# Patient Record
Sex: Male | Born: 1980 | Race: White | Hispanic: No | Marital: Married | Smoking: Current every day smoker
Health system: Southern US, Community
[De-identification: ages and names within clinical notes are randomized; demographics above are authoritative.]

## PROBLEM LIST (undated history)

## (undated) DIAGNOSIS — M109 Gout, unspecified: Secondary | ICD-10-CM

## (undated) DIAGNOSIS — I10 Essential (primary) hypertension: Secondary | ICD-10-CM

## (undated) DIAGNOSIS — R7989 Other specified abnormal findings of blood chemistry: Secondary | ICD-10-CM

## (undated) DIAGNOSIS — E119 Type 2 diabetes mellitus without complications: Secondary | ICD-10-CM

## (undated) DIAGNOSIS — G4733 Obstructive sleep apnea (adult) (pediatric): Secondary | ICD-10-CM

## (undated) HISTORY — DX: Gout, unspecified: M10.9

## (undated) HISTORY — PX: CHOLECYSTECTOMY: SHX55

## (undated) HISTORY — DX: Obstructive sleep apnea (adult) (pediatric): G47.33

## (undated) HISTORY — DX: Other specified abnormal findings of blood chemistry: R79.89

## (undated) HISTORY — DX: Essential (primary) hypertension: I10

---

## 2008-10-21 HISTORY — PX: WISDOM TOOTH EXTRACTION: SHX21

## 2014-03-07 ENCOUNTER — Ambulatory Visit (INDEPENDENT_AMBULATORY_CARE_PROVIDER_SITE_OTHER): Payer: 59 | Admitting: Physician Assistant

## 2014-03-07 ENCOUNTER — Encounter: Payer: Self-pay | Admitting: Physician Assistant

## 2014-03-07 VITALS — BP 132/79 | HR 74 | Temp 98.5°F | Ht 73.0 in | Wt 242.4 lb

## 2014-03-07 DIAGNOSIS — M109 Gout, unspecified: Secondary | ICD-10-CM | POA: Insufficient documentation

## 2014-03-07 MED ORDER — INDOMETHACIN 50 MG PO CAPS: 50.0000 mg | ORAL_CAPSULE | Freq: Three times a day (TID) | ORAL | Status: DC | PRN

## 2014-03-07 NOTE — Patient Instructions (Signed)

## 2014-03-07 NOTE — Addendum Note (Signed)
Addended by: Inis SizerWEBSTER, WILLIAM L on: 03/07/2014 01:23 PM   Modules accepted: Orders

## 2014-03-07 NOTE — Progress Notes (Signed)
Subjective:     Patient ID: Casey MeyerJames Michael Hoover, male   DOB: 12-20-1980, 33 y.o.   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

## 2014-04-04 DIAGNOSIS — Z0289 Encounter for other administrative examinations: Secondary | ICD-10-CM

## 2014-04-28 ENCOUNTER — Ambulatory Visit (INDEPENDENT_AMBULATORY_CARE_PROVIDER_SITE_OTHER): Payer: 59 | Admitting: Family Medicine

## 2014-04-28 ENCOUNTER — Encounter: Payer: Self-pay | Admitting: Family Medicine

## 2014-04-28 VITALS — BP 153/93 | HR 88 | Temp 98.6°F | Ht 73.0 in | Wt 343.2 lb

## 2014-04-28 DIAGNOSIS — H6011 Cellulitis of right external ear: Secondary | ICD-10-CM

## 2014-04-28 DIAGNOSIS — H9209 Otalgia, unspecified ear: Secondary | ICD-10-CM

## 2014-04-28 DIAGNOSIS — H60391 Other infective otitis externa, right ear: Secondary | ICD-10-CM

## 2014-04-28 DIAGNOSIS — H60399 Other infective otitis externa, unspecified ear: Secondary | ICD-10-CM

## 2014-04-28 DIAGNOSIS — H9201 Otalgia, right ear: Secondary | ICD-10-CM

## 2014-04-28 MED ORDER — NEOMYCIN-COLIST-HC-THONZONIUM 3.3-3-10-0.5 MG/ML OT SUSP: OTIC | Status: DC

## 2014-04-28 MED ORDER — TRAMADOL HCL 50 MG PO TABS: ORAL_TABLET | ORAL | Status: DC

## 2014-04-28 MED ORDER — CEPHALEXIN 500 MG PO CAPS: 500.0000 mg | ORAL_CAPSULE | Freq: Three times a day (TID) | ORAL | Status: DC

## 2014-04-28 NOTE — Progress Notes (Signed)
   Subjective:    Patient ID: Casey Hoover, male    DOB: April 21, 1981, 33 y.o.   MRN: [id number]  HPI  Pt is here today for acute onset of Right ear pain. It started hurting 2 days ago. The patient has been swimming a lot. He applied some ear drops last evening there   Review of Systems  Constitutional: Negative for fever.  HENT: Positive for ear pain (Right x 2 days).   Neurological: Negative for headaches.       Objective:   Physical Exam  Nursing note and vitals reviewed. Constitutional: He appears well-developed and well-nourished. No distress.  HENT:  Head: Normocephalic and atraumatic.  Left Ear: External ear normal.  Mouth/Throat: Oropharynx is clear and moist. No oropharyngeal exudate.  The right ear canal is swollen with drainage. The TM was not visualized. The left TM was normal. There is right anterior cervical tenderness.  Eyes: Conjunctivae and EOM are normal. Pupils are equal, round, and reactive to light. Right eye exhibits no discharge. Left eye exhibits no discharge. No scleral icterus.  Neck: Normal range of motion. Neck supple.  Cardiovascular: Normal rate, regular rhythm and normal heart sounds.   No murmur heard. Pulmonary/Chest: Effort normal and breath sounds normal. He has no wheezes. He has no rales.  Musculoskeletal: Normal range of motion.  Lymphadenopathy:    He has no cervical adenopathy.  Skin: Skin is warm and dry. No rash noted.  Psychiatric: He has a normal mood and affect. His behavior is normal. Judgment and thought content normal.   BP 153/93  Pulse 88  Temp(Src) 98.6 F (37 C) (Oral)  Ht 6\' 1"  (1.854 m)  Wt 343 lb 3.2 oz (155.674 kg)  BMI 45.29 kg/m2        Assessment & Plan:  1. Otitis, externa, infective, right - neomycin-colistin-hydrocortisone-thonzonium (CORTISPORIN-TC) 3.12-21-08-0.5 MG/ML otic suspension; Use 3-4 drops 4 times daily and apply to the cotton wick in the right ear canal for 7-10 day  Dispense: 10 mL; Refill:  1 - cephALEXin (KEFLEX) 500 MG capsule; Take 1 capsule (500 mg total) by mouth 3 (three) times daily.  Dispense: 30 capsule; Refill: 0  2. Cellulitis of ear canal, right - neomycin-colistin-hydrocortisone-thonzonium (CORTISPORIN-TC) 3.12-21-08-0.5 MG/ML otic suspension; Use 3-4 drops 4 times daily and apply to the cotton wick in the right ear canal for 7-10 day  Dispense: 10 mL; Refill: 1 - cephALEXin (KEFLEX) 500 MG capsule; Take 1 capsule (500 mg total) by mouth 3 (three) times daily.  Dispense: 30 capsule; Refill: 0  3. Ear pain, right - traMADol (ULTRAM) 50 MG tablet; One half to one tablet every 8 hours -12 hours as needed for severe pain  Dispense: 15 tablet; Refill: 0  Patient Instructions  Take antibiotic as directed Use prescription ear drops as directed with a cotton wick If swimming,  use ear plugs Discuss with pharmacist prophylactic ear drops that can be used before swimming and after swimming once the prescription ear drops and antibiotics are complete Take Tylenol if needed for pain, if severe pain may use tramadol   Nyra Capeson W. Da Authement MD

## 2014-04-28 NOTE — Patient Instructions (Signed)
Take antibiotic as directed Use prescription ear drops as directed with a cotton wick If swimming,  use ear plugs Discuss with pharmacist prophylactic ear drops that can be used before swimming and after swimming once the prescription ear drops and antibiotics are complete Take Tylenol if needed for pain, if severe pain may use tramadol

## 2014-05-06 ENCOUNTER — Telehealth: Payer: Self-pay | Admitting: Family Medicine

## 2014-05-06 NOTE — Telephone Encounter (Signed)
Give call to triage

## 2014-05-06 NOTE — Telephone Encounter (Signed)
Patient was seeing improvement until he discontinued drops on 7/15 and then the swelling and pain returned.  He is on vacation but has his drops with him. There was also one refill provided. Advised to continue with drops for several more days. If he needs a refill while at the beach he can have it transferred from his local pharmacy to one close by. Made them aware that there is always someone available by phone if symptoms persist or worsen.  Mother stated understanding and agreement to plan.

## 2014-06-21 ENCOUNTER — Telehealth: Payer: Self-pay | Admitting: Family Medicine

## 2014-06-21 DIAGNOSIS — G4730 Sleep apnea, unspecified: Secondary | ICD-10-CM

## 2014-06-21 NOTE — Telephone Encounter (Signed)
Please review

## 2014-06-21 NOTE — Telephone Encounter (Signed)
If the patient has not already been scheduled for his, please do this with Dr. Jetty Duhamel

## 2014-06-21 NOTE — Telephone Encounter (Signed)
Referral set up

## 2014-08-04 ENCOUNTER — Encounter: Payer: Self-pay | Admitting: Pulmonary Disease

## 2014-08-04 ENCOUNTER — Ambulatory Visit (INDEPENDENT_AMBULATORY_CARE_PROVIDER_SITE_OTHER): Payer: 59 | Admitting: Pulmonary Disease

## 2014-08-04 VITALS — BP 152/84 | HR 75 | Temp 98.3°F | Ht 73.0 in | Wt 353.8 lb

## 2014-08-04 DIAGNOSIS — G4733 Obstructive sleep apnea (adult) (pediatric): Secondary | ICD-10-CM

## 2014-08-04 DIAGNOSIS — R0683 Snoring: Secondary | ICD-10-CM

## 2014-08-04 HISTORY — DX: Obstructive sleep apnea (adult) (pediatric): G47.33

## 2014-08-04 NOTE — Progress Notes (Signed)
Chief Complaint  Patient presents with  . SLEEP CONSULT    Referred by Dr Christell ConstantMoore. Shifts vary with work. Epworth Score: 17    History of Present Illness: Casey Hoover is a 33 y.o. male for evaluation of sleep problems.  [place] after he got an ear infection complicated by sepsis.  He was told by doctors then that he needed evaluation for sleep apnea.  He is a loud snorer, and he has been told he stops breathing while asleep.  He has more trouble on his back.  He will wake up feeling sweaty and with palpitations.  He works rotating shift with [employer].  He works 12 hour shifts 3 to 5 days per week.  He does 2 weeks of days, and then 2 weeks of nights.  He gets about 5 hours sleep per day on average.  He feels tired through out the day.  He does not use anything to help him sleep or stay awake.  He will talk in his sleep.  He denies sleep walking, sleep talking, bruxism, or nightmares.  There is no history of restless legs.  He denies sleep hallucinations, sleep paralysis, or cataplexy.  The Epworth score is 17 out of 24.   Casey MeyerJames Michael Hoover  has a past medical history of Gout and HTN (hypertension).  Casey Hoover  has past surgical history that includes Wisdom tooth extraction (2010).  Prior to Admission medications   Medication Sig Start Date End Date Taking? Authorizing Provider  HYDROcodone-acetaminophen (NORCO/VICODIN) 5-325 MG per tablet Take 1 tablet by mouth every 6 (six) hours as needed for moderate pain.   Yes Historical Provider, MD  neomycin-colistin-hydrocortisone-thonzonium (CORTISPORIN-TC) 3.12-21-08-0.5 MG/ML otic suspension Use 3-4 drops 4 times daily and apply to the cotton wick in the right ear canal for 7-10 day 04/28/14  Yes Ernestina Pennaonald W Moore, MD    Allergies  Allergen Reactions  . Sulfur Rash    His family history includes Cancer in his father; Diabetes in his other; Heart Problems in his other; Hypertension in his  other.  He  reports that he has been smoking Cigarettes.  He has a 15 pack-year smoking history. He does not have any smokeless tobacco history on file. He reports that he drinks alcohol. He reports that he does not use illicit drugs.  Review of Systems  Constitutional: Negative for fever and unexpected weight change.  HENT: Positive for ear pain. Negative for congestion, dental problem, nosebleeds, postnasal drip, rhinorrhea, sinus pressure, sneezing, sore throat and trouble swallowing.   Eyes: Negative for redness and itching.  Respiratory: Positive for shortness of breath ( rest/exertion). Negative for cough, chest tightness and wheezing.   Cardiovascular: Negative for palpitations and leg swelling.  Gastrointestinal: Negative for nausea and vomiting.       Acid heartburn/indigestion  Genitourinary: Negative for dysuria.  Musculoskeletal: Negative for joint swelling.  Skin: Negative for rash.  Neurological: Negative for headaches.  Hematological: Does not bruise/bleed easily.  Psychiatric/Behavioral: Negative for dysphoric mood. The patient is not nervous/anxious.    Physical Exam:  General - No distress ENT - No sinus tenderness, no oral exudate, no LAN, no thyromegaly, TM clear, pupils equal/reactive, MP 4, 2+ tonsils, enlarged tongue Cardiac - s1s2 regular, no murmur, pulses symmetric Chest - No wheeze/rales/dullness, good air entry, normal respiratory excursion Back - No focal tenderness Abd - Soft, non-tender, no organomegaly, + bowel sounds Ext - No edema Neuro - Normal strength,  cranial nerves intact Skin - No rashes Psych - Normal mood, and behavior  Assessment/plan:  Chesley Mires, M.D. Pager [phone]

## 2014-08-04 NOTE — Progress Notes (Deleted)
   Subjective:    Patient ID: Casey Hoover, male    DOB: December 31, 1980, 31 y.o.   MRN: [id number]  HPI    Review of Systems  Constitutional: Negative for fever and unexpected weight change.  HENT: Positive for ear pain. Negative for congestion, dental problem, nosebleeds, postnasal drip, rhinorrhea, sinus pressure, sneezing, sore throat and trouble swallowing.   Eyes: Negative for redness and itching.  Respiratory: Positive for shortness of breath ( rest/exertion). Negative for cough, chest tightness and wheezing.   Cardiovascular: Negative for palpitations and leg swelling.  Gastrointestinal: Negative for nausea and vomiting.       Acid heartburn/indigestion  Genitourinary: Negative for dysuria.  Musculoskeletal: Negative for joint swelling.  Skin: Negative for rash.  Neurological: Negative for headaches.  Hematological: Does not bruise/bleed easily.  Psychiatric/Behavioral: Negative for dysphoric mood. The patient is not nervous/anxious.        Objective:   Physical Exam        Assessment & Plan:

## 2014-08-04 NOTE — Patient Instructions (Signed)
Will arrange for home sleep study Will call to arrange for follow up after sleep study reviewed  

## 2014-08-04 NOTE — Assessment & Plan Note (Signed)
He has snoring, sleep disruption, witnessed apnea, and daytime sleepiness.  His BMI is > 35.  I am concerned he could have sleep apnea.  We discussed how sleep apnea can affect various health problems including risks for hypertension, cardiovascular disease, and diabetes.  We also discussed how sleep disruption can increase risks for accident, such as while driving.  Weight loss as a means of improving sleep apnea was also reviewed.  Additional treatment options discussed were CPAP therapy, oral appliance, and surgical intervention.  To further assess will arrange for home sleep study pending insurance approval.

## 2014-09-19 ENCOUNTER — Telehealth: Payer: Self-pay | Admitting: Pulmonary Disease

## 2014-09-19 NOTE — Telephone Encounter (Signed)
Called and spoke with pt's wife and she stated that she would be able to pick up the device on Wed 09/21/14. HST arranged for 09/21/14. Wife to pick up device at 3:30 on 09/21/14. Rhonda J Cobb

## 2014-09-19 NOTE — Telephone Encounter (Signed)
[facility] please advise where the pt is on the waiting list. Thanks. Carron CurieJennifer Maleni Seyer, CMA

## 2014-09-21 DIAGNOSIS — G4730 Sleep apnea, unspecified: Secondary | ICD-10-CM

## 2014-09-29 ENCOUNTER — Encounter: Payer: Self-pay | Admitting: Pulmonary Disease

## 2014-09-29 ENCOUNTER — Telehealth: Payer: Self-pay | Admitting: Pulmonary Disease

## 2014-09-29 DIAGNOSIS — G4730 Sleep apnea, unspecified: Secondary | ICD-10-CM

## 2014-09-29 NOTE — Telephone Encounter (Signed)
Dr. Craige CottaSood will be back in the office tomorrow. Pt had HST done. [name] is aware we will call once Dr. Craige CottaSood reviews results. Please advise thanks

## 2014-09-29 NOTE — Telephone Encounter (Signed)
HST 12/012/15 >> AHI 33.9, SaO2 low 77%.  Will have my nurse inform pt that sleep study shows severe sleep apnea.  Options are either to start CPAP now or have ROV first.  If he is agreeable to start CPAP now, then arrange for auto CPAP with pressure 5 to 15 cm H2O with heated humidity and mask of choice.  Have download sent two weeks after starting CPAP and schedule ROV 2 months after starting CPAP.

## 2014-10-03 ENCOUNTER — Encounter: Payer: Self-pay | Admitting: Pulmonary Disease

## 2014-10-03 ENCOUNTER — Telehealth: Payer: Self-pay | Admitting: Pulmonary Disease

## 2014-10-03 DIAGNOSIS — G4733 Obstructive sleep apnea (adult) (pediatric): Secondary | ICD-10-CM

## 2014-10-03 NOTE — Telephone Encounter (Signed)
Spoke with Danille (SO), will contact our office back once she speaks with with patient.  Will await call.

## 2014-10-03 NOTE — Telephone Encounter (Signed)
Per 09/29/14 phone note: Coralyn HellingVineet Sood, MD at 09/29/2014 5:34 PM     Status: Signed       Expand All Collapse All   HST 12/012/15 >> AHI 33.9, SaO2 low 77%.  Will have my nurse inform pt that sleep study shows severe sleep apnea.  Options are either to start CPAP now or have ROV first.  If he is agreeable to start CPAP now, then arrange for auto CPAP with pressure 5 to 15 cm H2O with heated humidity and mask of choice. Have download sent two weeks after starting CPAP and schedule ROV 2 months after starting CPAP.    --  LMOMTCB X1

## 2014-10-04 NOTE — Telephone Encounter (Signed)
Spoke with pt's girlfriend and she states pt is ready to start cpap therapy and would like to get this before the end of the year when deductible starts over .  Order placed per Dr Craige CottaSood.

## 2014-10-06 NOTE — Telephone Encounter (Signed)
Per 12.14.15 phone note, CPAP order placed Will sign off

## 2014-10-17 ENCOUNTER — Telehealth: Payer: Self-pay | Admitting: Pulmonary Disease

## 2014-10-17 NOTE — Telephone Encounter (Signed)
[facility]'s [facility] that [facility] on 12.15.15 Called [facility] and spoke with Marchelle FolksAmanda who reported that their system shows no sign of order being received.  She did provide a fax number to send order/records to.  Did advise Marchelle Folksmanda that pt had requested to be set up with CPAP prior to the end of the calendar year.  With Dawne's assistance, printed off the order (that also states pt wants CPAP before the end of the year), the sleep study, last ov note and copy if insurance card.  Order stamped and all records faxed to [facility].  LMOM TCB x1 for Danielle to discuss the above.

## 2014-10-18 NOTE — Telephone Encounter (Signed)
Spoke with United StationersDanielle.  Discussed below.  She wants to clarify exactly when [facility] will be calling. [facility], spoke with Marchelle FolksAmanda.  Was advised they did receive order from yesterday and would be contacting pt today. Spoke with Duwayne Heckanielle again.  Advised of above per Marchelle FolksAmanda.  She verbalized understanding, is to call office back if [facility] does not reach out to them today, and voiced no further questions or concerns at this time.  She was very appreciative of our help.

## 2015-07-11 ENCOUNTER — Ambulatory Visit (INDEPENDENT_AMBULATORY_CARE_PROVIDER_SITE_OTHER): Payer: 59 | Admitting: Family Medicine

## 2015-07-11 ENCOUNTER — Encounter: Payer: Self-pay | Admitting: Family Medicine

## 2015-07-11 VITALS — BP 156/88 | HR 87 | Temp 98.3°F | Ht 73.0 in | Wt 365.0 lb

## 2015-07-11 DIAGNOSIS — R5382 Chronic fatigue, unspecified: Secondary | ICD-10-CM | POA: Diagnosis not present

## 2015-07-11 DIAGNOSIS — E669 Obesity, unspecified: Secondary | ICD-10-CM

## 2015-07-11 DIAGNOSIS — E1159 Type 2 diabetes mellitus with other circulatory complications: Secondary | ICD-10-CM | POA: Insufficient documentation

## 2015-07-11 DIAGNOSIS — I152 Hypertension secondary to endocrine disorders: Secondary | ICD-10-CM | POA: Insufficient documentation

## 2015-07-11 DIAGNOSIS — R5383 Other fatigue: Secondary | ICD-10-CM | POA: Insufficient documentation

## 2015-07-11 DIAGNOSIS — I10 Essential (primary) hypertension: Secondary | ICD-10-CM | POA: Diagnosis not present

## 2015-07-11 DIAGNOSIS — M109 Gout, unspecified: Secondary | ICD-10-CM

## 2015-07-11 MED ORDER — PREDNISONE 20 MG PO TABS: ORAL_TABLET | ORAL | Status: DC

## 2015-07-11 MED ORDER — LOSARTAN POTASSIUM 25 MG PO TABS: 25.0000 mg | ORAL_TABLET | Freq: Every day | ORAL | Status: DC

## 2015-07-11 NOTE — Patient Instructions (Addendum)
Nice to meet you!  Start the losartan as soon as possible  We will call with your labs within a week.   Hypertension Hypertension, commonly called high blood pressure, is when the force of blood pumping through your arteries is too strong. Your arteries are the blood vessels that carry blood from your heart throughout your body. A blood pressure reading consists of a higher number over a lower number, such as 110/72. The higher number (systolic) is the pressure inside your arteries when your heart pumps. The lower number (diastolic) is the pressure inside your arteries when your heart relaxes. Ideally you want your blood pressure below 120/80. Hypertension forces your heart to work harder to pump blood. Your arteries may become narrow or stiff. Having hypertension puts you at risk for heart disease, stroke, and other problems.  RISK FACTORS Some risk factors for high blood pressure are controllable. Others are not.  Risk factors you cannot control include:   Race. You may be at higher risk if you are African American.  Age. Risk increases with age.  Gender. Men are at higher risk than women before age 62 years. After age 41, women are at higher risk than men. Risk factors you can control include:  Not getting enough exercise or physical activity.  Being overweight.  Getting too much fat, sugar, calories, or salt in your diet.  Drinking too much alcohol. SIGNS AND SYMPTOMS Hypertension does not usually cause signs or symptoms. Extremely high blood pressure (hypertensive crisis) may cause headache, anxiety, shortness of breath, and nosebleed. DIAGNOSIS  To check if you have hypertension, your health care provider will measure your blood pressure while you are seated, with your arm held at the level of your heart. It should be measured at least twice using the same arm. Certain conditions can cause a difference in blood pressure between your right and left arms. A blood pressure reading  that is higher than normal on one occasion does not mean that you need treatment. If one blood pressure reading is high, ask your health care provider about having it checked again. TREATMENT  Treating high blood pressure includes making lifestyle changes and possibly taking medicine. Living a healthy lifestyle can help lower high blood pressure. You may need to change some of your habits. Lifestyle changes may include:  Following the DASH diet. This diet is high in fruits, vegetables, and whole grains. It is low in salt, red meat, and added sugars.  Getting at least 2 hours of brisk physical activity every week.  Losing weight if necessary.  Not smoking.  Limiting alcoholic beverages.  Learning ways to reduce stress. If lifestyle changes are not enough to get your blood pressure under control, your health care provider may prescribe medicine. You may need to take more than one. Work closely with your health care provider to understand the risks and benefits. HOME CARE INSTRUCTIONS  Have your blood pressure rechecked as directed by your health care provider.   Take medicines only as directed by your health care provider. Follow the directions carefully. Blood pressure medicines must be taken as prescribed. The medicine does not work as well when you skip doses. Skipping doses also puts you at risk for problems.   Do not smoke.   Monitor your blood pressure at home as directed by your health care provider. SEEK MEDICAL CARE IF:   You think you are having a reaction to medicines taken.  You have recurrent headaches or feel dizzy.  You have  swelling in your ankles.  You have trouble with your vision. SEEK IMMEDIATE MEDICAL CARE IF:  You develop a severe headache or confusion.  You have unusual weakness, numbness, or feel faint.  You have severe chest or abdominal pain.  You vomit repeatedly.  You have trouble breathing. MAKE SURE YOU:   Understand these  instructions.  Will watch your condition.  Will get help right away if you are not doing well or get worse. Document Released: 10/07/2005 Document Revised: 02/21/2014 Document Reviewed: 07/30/2013 [facility] Patient Information 2015 [place], [place]. This information is not intended to replace advice given to you by your health care provider. Make sure you discuss any questions you have with your health care provider.

## 2015-07-11 NOTE — Progress Notes (Signed)
   HPI  Patient presents today here for knee pain and hypertension  Knee pain Patient spines he's had left knee pain over the last 5 days or so. He denies any injury. He states that it was red, warm, and slightly swollen. He has a history of gout but has never had gout in his knees.  He denies any fever, chills, sweats.  Fatigue He does complain of decreased energy, weight gain, and generalized fatigue over the last year. He also has decreased sex drive but denies any erectile dysfunction.  Hypertension No chest pain, dyspnea, palpitations Blood pressures frequently slightly elevated in the 150s   PMH: Smoking status noted - interested in quitting but is not sure if he wants to get ROS: Per HPI  Objective: BP 156/88 mmHg  Pulse 87  Temp(Src) 98.3 F (36.8 C) (Oral)  Ht $R'6\' 1"'IT$  (1.854 m)  Wt 365 lb (165.563 kg)  BMI 48.17 kg/m2 Gen: NAD, alert, cooperative with exam HEENT: NCAT CV: RRR, good S1/S2, no murmur Resp: CTABL, no wheezes, non-labored Ext: No edema, warm Neuro: Alert and oriented, No gross deficits MSK: L knee without erythema, effusion, bruising, or gross deformity No joint line tenderness.  No crepitus  Assessment and plan:  # Gout Check uric acid Prednisone course for presumed flare   # Hypertension Elevated on at least 2 occasions Starting losartan, has uricosuric effect Avoid thiazides Labs, follow-up one month for repeat labs and repeat BP check  # Fatigue Checking labs No clear etiology at this time, nonspecific    Orders Placed This Encounter  Procedures  . CBC  . CMP14+EGFR  . TSH  . Testosterone,Free and Total  . Uric acid    Meds ordered this encounter  Medications  . predniSONE (DELTASONE) 20 MG tablet    Sig: Take 2 tabs daily for 4 days, then take 1 tab daily for 4 days, then take one half tab daily 4 days then stop.    Dispense:  14 tablet    Refill:  0  . losartan (COZAAR) 25 MG tablet    Sig: Take 1 tablet (25 mg  total) by mouth daily.    Dispense:  30 tablet    Refill:  [place], MD [facility] 07/11/2015, 4:41 PM

## 2015-07-12 LAB — CMP14+EGFR
A/G RATIO: 1.9 (ref 1.1–2.5)
ALT: 49 IU/L — AB (ref 0–44)
AST: 18 IU/L (ref 0–40)
Albumin: 4.5 g/dL (ref 3.5–5.5)
Alkaline Phosphatase: 87 IU/L (ref 39–117)
BILIRUBIN TOTAL: 0.7 mg/dL (ref 0.0–1.2)
BUN/Creatinine Ratio: 9 (ref 8–19)
BUN: 11 mg/dL (ref 6–20)
CHLORIDE: 98 mmol/L (ref 97–108)
CO2: 25 mmol/L (ref 18–29)
Calcium: 9.8 mg/dL (ref 8.7–10.2)
Creatinine, Ser: 1.17 mg/dL (ref 0.76–1.27)
GFR calc non Af Amer: 82 mL/min/{1.73_m2} (ref >59)
GFR, EST AFRICAN AMERICAN: 95 mL/min/{1.73_m2} (ref >59)
Globulin, Total: 2.4 g/dL (ref 1.5–4.5)
Glucose: 162 mg/dL — ABNORMAL HIGH (ref 65–99)
POTASSIUM: 4.5 mmol/L (ref 3.5–5.2)
Sodium: 140 mmol/L (ref 134–144)
Total Protein: 6.9 g/dL (ref 6.0–8.5)

## 2015-07-12 LAB — TSH: TSH: 1.62 u[IU]/mL (ref 0.450–4.500)

## 2015-07-12 LAB — TESTOSTERONE,FREE AND TOTAL
TESTOSTERONE: 169 ng/dL — AB (ref 348–1197)
Testosterone, Free: 9.4 pg/mL (ref 8.7–25.1)

## 2015-07-12 LAB — CBC
Hematocrit: 47.7 % (ref 37.5–51.0)
Hemoglobin: 16.3 g/dL (ref 12.6–17.7)
MCH: 30.1 pg (ref 26.6–33.0)
MCHC: 34.2 g/dL (ref 31.5–35.7)
MCV: 88 fL (ref 79–97)
PLATELETS: 236 10*3/uL (ref 150–379)
RBC: 5.42 x10E6/uL (ref 4.14–5.80)
RDW: 13.6 % (ref 12.3–15.4)
WBC: 14.3 10*3/uL — ABNORMAL HIGH (ref 3.4–10.8)

## 2015-07-12 LAB — URIC ACID: Uric Acid: 7.7 mg/dL (ref 3.7–8.6)

## 2015-07-15 ENCOUNTER — Other Ambulatory Visit: Payer: Self-pay | Admitting: *Deleted

## 2015-07-15 DIAGNOSIS — R899 Unspecified abnormal finding in specimens from other organs, systems and tissues: Secondary | ICD-10-CM

## 2015-08-08 ENCOUNTER — Ambulatory Visit: Payer: 59 | Admitting: Family Medicine

## 2015-08-09 ENCOUNTER — Encounter: Payer: Self-pay | Admitting: Family Medicine

## 2015-08-23 ENCOUNTER — Telehealth: Payer: Self-pay | Admitting: Family Medicine

## 2015-09-25 ENCOUNTER — Telehealth: Payer: Self-pay | Admitting: Family Medicine

## 2015-09-25 NOTE — Telephone Encounter (Signed)
DENIED 

## 2015-12-11 NOTE — Telephone Encounter (Signed)
Appointment made tomorrow for med check

## 2015-12-12 ENCOUNTER — Ambulatory Visit: Payer: Self-pay | Admitting: Family Medicine

## 2015-12-13 ENCOUNTER — Telehealth: Payer: Self-pay | Admitting: Family Medicine

## 2015-12-13 ENCOUNTER — Encounter: Payer: Self-pay | Admitting: Family Medicine

## 2015-12-13 MED ORDER — OSELTAMIVIR PHOSPHATE 75 MG PO CAPS: 75.0000 mg | ORAL_CAPSULE | Freq: Every day | ORAL | Status: DC

## 2015-12-13 NOTE — Telephone Encounter (Signed)
Called and left VM  Sent in tamiflu at ppx dose.   Murtis Sink, MD [facility] 12/13/2015, 5:50 PM

## 2015-12-14 ENCOUNTER — Other Ambulatory Visit: Payer: Self-pay | Admitting: Nurse Practitioner

## 2015-12-23 ENCOUNTER — Emergency Department (HOSPITAL_COMMUNITY)
Admission: EM | Admit: 2015-12-23 | Discharge: 2015-12-23 | Disposition: A | Discharge status: Home / Self Care | Payer: 59 | Attending: Emergency Medicine | Admitting: Emergency Medicine

## 2015-12-23 ENCOUNTER — Emergency Department (HOSPITAL_COMMUNITY): Payer: 59

## 2015-12-23 ENCOUNTER — Encounter (HOSPITAL_COMMUNITY): Payer: Self-pay | Admitting: *Deleted

## 2015-12-23 DIAGNOSIS — R0789 Other chest pain: Secondary | ICD-10-CM | POA: Diagnosis not present

## 2015-12-23 DIAGNOSIS — R110 Nausea: Secondary | ICD-10-CM | POA: Diagnosis not present

## 2015-12-23 DIAGNOSIS — R079 Chest pain, unspecified: Secondary | ICD-10-CM | POA: Diagnosis present

## 2015-12-23 DIAGNOSIS — R61 Generalized hyperhidrosis: Secondary | ICD-10-CM | POA: Diagnosis not present

## 2015-12-23 DIAGNOSIS — Z9119 Patient's noncompliance with other medical treatment and regimen: Secondary | ICD-10-CM | POA: Insufficient documentation

## 2015-12-23 DIAGNOSIS — R0602 Shortness of breath: Secondary | ICD-10-CM | POA: Diagnosis not present

## 2015-12-23 DIAGNOSIS — I10 Essential (primary) hypertension: Secondary | ICD-10-CM | POA: Diagnosis not present

## 2015-12-23 DIAGNOSIS — F17210 Nicotine dependence, cigarettes, uncomplicated: Secondary | ICD-10-CM | POA: Insufficient documentation

## 2015-12-23 DIAGNOSIS — Z79899 Other long term (current) drug therapy: Secondary | ICD-10-CM | POA: Diagnosis not present

## 2015-12-23 DIAGNOSIS — G8929 Other chronic pain: Secondary | ICD-10-CM | POA: Diagnosis not present

## 2015-12-23 DIAGNOSIS — Z8669 Personal history of other diseases of the nervous system and sense organs: Secondary | ICD-10-CM | POA: Diagnosis not present

## 2015-12-23 DIAGNOSIS — M25511 Pain in right shoulder: Secondary | ICD-10-CM | POA: Insufficient documentation

## 2015-12-23 LAB — I-STAT TROPONIN, ED
TROPONIN I, POC: 0 ng/mL (ref 0.00–0.08)
Troponin i, poc: 0 ng/mL (ref 0.00–0.08)

## 2015-12-23 LAB — CBC
HEMATOCRIT: 48.4 % (ref 39.0–52.0)
Hemoglobin: 16.1 g/dL (ref 13.0–17.0)
MCH: 29.9 pg (ref 26.0–34.0)
MCHC: 33.3 g/dL (ref 30.0–36.0)
MCV: 89.8 fL (ref 78.0–100.0)
PLATELETS: 211 10*3/uL (ref 150–400)
RBC: 5.39 MIL/uL (ref 4.22–5.81)
RDW: 13.1 % (ref 11.5–15.5)
WBC: 10.6 10*3/uL — AB (ref 4.0–10.5)

## 2015-12-23 LAB — BASIC METABOLIC PANEL
Anion gap: 10 (ref 5–15)
BUN: 10 mg/dL (ref 6–20)
CHLORIDE: 105 mmol/L (ref 101–111)
CO2: 28 mmol/L (ref 22–32)
Calcium: 9.6 mg/dL (ref 8.9–10.3)
Creatinine, Ser: 1.33 mg/dL — ABNORMAL HIGH (ref 0.61–1.24)
GFR calc non Af Amer: >60 mL/min (ref >60)
Glucose, Bld: 99 mg/dL (ref 65–99)
POTASSIUM: 3.8 mmol/L (ref 3.5–5.1)
SODIUM: 143 mmol/L (ref 135–145)

## 2015-12-23 NOTE — ED Notes (Signed)
[facility]/o substernal cp beginning at 320pm today with N/V/SOB.  Hx: HTN.  EMS reports pt being clammy on arrival, resolved PTA.  324 ASA given, EKG unremarkable.  Pt reports noncompliance with HTN meds.  Pt a x 4, NAD. Bp-206/92 P-100 O2-96% RA.

## 2015-12-23 NOTE — ED Provider Notes (Signed)
CSN: [id number]     Arrival date & time 12/23/15  1713 History   First MD Initiated Contact with Patient 12/23/15 1726     Chief Complaint  Patient presents with  . Chest Pain     (Consider location/radiation/quality/duration/timing/severity/associated sxs/prior Treatment) HPI  35 year old male presents with chest pain left lateral to the sternum. Patient states the pain has been constant since it started 2 weeks ago but seemed to acutely worsen about 1 PM today. Patient was sitting on the couch when all of a sudden seem to get worse. He got nauseated and thinks he broke out in a sweat. Had some shortness of breath initially as well. Patient was given 3-4 mg aspirin. Patient is apparently noncompliant with hypertension medicine. Initial BP 206/92 by EMS. He was not given any nitroglycerin. Patient states he thinks the chest pain started due to stress because his grandmother was ill and then passed away one week ago. Patient does not have any worsening of his symptoms with exertion. It has been somewhat worse with inspiration. Also having chronic right shoulder pain that he thinks is from a rotator cuff. This is been ongoing for several months, not worse with this chest pain. No back pain. No abdominal pain. Pain has significantly improved while on route from EMS.  Past Medical History  Diagnosis Date  . Gout   . HTN (hypertension)   . OSA (obstructive sleep apnea) 08/04/2014   Past Surgical History  Procedure Laterality Date  . Wisdom tooth extraction  2010   Family History  Problem Relation Age of Onset  . Cancer Father   . Hypertension Other     multiple family members  . Diabetes Other     multiple family members  . Heart Problems Other     multiple family members   Social History  Substance Use Topics  . Smoking status: Current Every Day Smoker -- 1.00 packs/day for 15 years    Types: Cigarettes  . Smokeless tobacco: None  . Alcohol Use: Yes     Comment: social    Review  of Systems  Constitutional: Positive for diaphoresis. Negative for fever.  Respiratory: Negative for shortness of breath.   Cardiovascular: Positive for chest pain.  Gastrointestinal: Positive for nausea. Negative for vomiting and abdominal pain.  Musculoskeletal: Positive for arthralgias (right shoulder).  All other systems reviewed and are negative.     Allergies  Sulfur  Home Medications   Prior to Admission medications   Medication Sig Start Date End Date Taking? Authorizing Provider  losartan (COZAAR) 25 MG tablet Take 1 tablet (25 mg total) by mouth daily. 07/11/15   Elenora Gamma, MD  oseltamivir (TAMIFLU) 75 MG capsule Take 1 capsule (75 mg total) by mouth daily. 12/13/15   Elenora Gamma, MD  predniSONE (DELTASONE) 20 MG tablet Take 2 tabs daily for 4 days, then take 1 tab daily for 4 days, then take one half tab daily 4 days then stop. 07/11/15   Elenora Gamma, MD   BP 119/55 mmHg  Pulse 90  Temp(Src) 98.1 F (36.7 C) (Oral)  Resp 17  Ht  (1.854 m)  Wt 322 lb (146.058 kg)  BMI 42.49 kg/m2  SpO2 96% Physical Exam  Constitutional: He is oriented to person, place, and time. He appears well-developed and well-nourished. No distress.  obese  HENT:  Head: Normocephalic and atraumatic.  Right Ear: External ear normal.  Left Ear: External ear normal.  Nose: Nose normal.  Eyes:  Right eye exhibits no discharge. Left eye exhibits no discharge.  Neck: Neck supple.  Cardiovascular: Normal rate, regular rhythm, normal heart sounds and intact distal pulses.   Pulses:      Radial pulses are 2+ on the right side, and 2+ on the left side.  Pulmonary/Chest: Effort normal and breath sounds normal. He exhibits tenderness.    Abdominal: Soft. There is no tenderness.  Musculoskeletal: He exhibits no edema.       Right shoulder: He exhibits decreased range of motion and tenderness.  Neurological: He is alert and oriented to person, place, and time.  Skin: Skin is  warm and dry. He is not diaphoretic.  Nursing note and vitals reviewed.   ED Course  Procedures (including critical care time) Labs Review Labs Reviewed  BASIC METABOLIC PANEL - Abnormal; Notable for the following:    Creatinine, Ser 1.33 (*)    All other components within normal limits  CBC - Abnormal; Notable for the following:    WBC 10.6 (*)    All other components within normal limits  I-STAT TROPOININ, ED  Rosezena SensorI-STAT TROPOININ, ED    Imaging Review Dg Chest 2 View  12/23/2015  CLINICAL DATA:  Substernal chest pain. Nausea with vomiting. Shortness of breath. Symptoms beginning today. EXAM: CHEST  2 VIEW COMPARISON:  None. FINDINGS: The heart size and mediastinal contours are within normal limits. Both lungs are clear. The visualized skeletal structures are unremarkable. IMPRESSION: No active cardiopulmonary disease. Electronically Signed   By: Myles RosenthalJohn  Stahl M.D.   On: 12/23/2015 18:55   I have personally reviewed and evaluated these images and lab results as part of my medical decision-making.   EKG Interpretation   Date/Time:  Saturday December 23 2015 17:15:14 EST Ventricular Rate:  96 PR Interval:  133 QRS Duration: 98 QT Interval:  356 QTC Calculation: 450 R Axis:   -88 Text Interpretation:  Sinus rhythm Left anterior fascicular block no acute  ST/T changes No old tracing to compare Confirmed by Laritza Vokes  MD, Cherokee Boccio  ([id number]) on 12/23/2015 5:27:20 PM      MDM   Final diagnoses:  Chest wall pain    35 year old male with atypical chest pain that has been constant for 2 weeks that worsened tonight. ECG shows no concerning findings. Has 2 negative troponins. Low suspicion for PE with no risk factors, patient is PERC negative.  Highly doubt dissection. Likely chest wall etiology given reproducible pain. He declines pain medicine in the ED. Discussed the importance of compliance with his hypertension medicines as well as smoking cessation. Recommend close follow-up with PCP and  discussed strict return precautions.    Pricilla LovelessScott Cassie Shedlock, MD 12/23/15 [phone]

## 2015-12-26 ENCOUNTER — Encounter: Payer: Self-pay | Admitting: Family Medicine

## 2015-12-26 ENCOUNTER — Ambulatory Visit (INDEPENDENT_AMBULATORY_CARE_PROVIDER_SITE_OTHER): Payer: 59 | Admitting: Family Medicine

## 2015-12-26 VITALS — BP 144/89 | HR 88 | Temp 97.2°F | Ht 73.0 in | Wt 357.2 lb

## 2015-12-26 DIAGNOSIS — R5382 Chronic fatigue, unspecified: Secondary | ICD-10-CM | POA: Diagnosis not present

## 2015-12-26 DIAGNOSIS — R0789 Other chest pain: Secondary | ICD-10-CM

## 2015-12-26 DIAGNOSIS — I10 Essential (primary) hypertension: Secondary | ICD-10-CM | POA: Diagnosis not present

## 2015-12-26 DIAGNOSIS — Z720 Tobacco use: Secondary | ICD-10-CM

## 2015-12-26 NOTE — Patient Instructions (Signed)
Great to see you!  Come back with any concerns  We will try to get you in to see Dr. Excell Seltzerooper soon.

## 2015-12-26 NOTE — Progress Notes (Signed)
   HPI  Patient presents today for emergency room follow-up.  Impression emergency room over the weekend for left-sided chest pain.  Patient explains he has left-sided chest pain described as pressure type pain that is worse with exertion, and continuous. Has been present for about 2 weeks and got worse over the weekend whenever he went to the ER. He had evaluation including EKG and troponins which were all normal.  He denies any palpitations or sweating with the pain. It does get worse with exertion so he is not going to work right now.  He has started taking his blood pressure medicine, losartan, as well as a daily aspirin. He states that it's different than heartburn type pain, also it is not reproducible.   PMH: Smoking status noted ROS: Per HPI  Objective: BP 144/89 mmHg  Pulse 88  Temp(Src) 97.2 F (36.2 C) (Oral)  Ht 6' 1" (1.854 m)  Wt 357 lb 3.2 oz (162.025 kg)  BMI 47.14 kg/m2 Gen: NAD, alert, cooperative with exam HEENT: NCAT CV: RRR, good S1/S2, no murmur, left-sided slight tenderness to palpation that is not reproducing pain of his costochondral joints Resp: CTABL, no wheezes, non-labored Abd: SNTND, BS present, no guarding or organomegaly Ext: No edema, warm Neuro: Alert and oriented, No gross deficits   EKG- NSR  Assessment and plan:  # Atypical chest pain Considering age very unlikely that he has coronary artery disease, however considering that this is exertional chest pain, pressure-like, and left-sided we will treat it with great respect EKG unchanged from the ER today. Referral to cardiology, we will try to get him in very soon considering that he's been advised to stay out of work Discussed daily aspirin, continue antihypertensives  # Hypertension Borderline elevated today Consider adding beta blocker, but would likely increase ARB first He's only been taking it for 3-4 days at this point so I'll leave it at this current dose.  #  Fatigue Previous testosterone low, will go ahead and recheck with an LH and Edna  # Tobacco Abuse Discussed, he is cutting back     Orders Placed This Encounter  Procedures  . FSH/LH  . Testosterone  . Lipid panel  . BMP8+EGFR  . Ambulatory referral to Cardiology    Referral Priority:  Routine    Referral Type:  Consultation    Referral Reason:  Specialty Services Required    Requested Specialty:  Cardiology    Number of Visits Requested:  1    No orders of the defined types were placed in this encounter.    Laroy Apple, MD [facility] 12/26/2015, 9:56 AM

## 2015-12-27 LAB — FSH/LH
FSH: 3.7 m[IU]/mL (ref 1.5–12.4)
LH: 3.4 m[IU]/mL (ref 1.7–8.6)

## 2015-12-27 LAB — BMP8+EGFR
BUN/Creatinine Ratio: 9 (ref 8–19)
BUN: 10 mg/dL (ref 6–20)
CO2: 23 mmol/L (ref 18–29)
Calcium: 9.6 mg/dL (ref 8.7–10.2)
Chloride: 100 mmol/L (ref 96–106)
Creatinine, Ser: 1.13 mg/dL (ref 0.76–1.27)
GFR, EST AFRICAN AMERICAN: 99 mL/min/{1.73_m2} (ref >59)
GFR, EST NON AFRICAN AMERICAN: 86 mL/min/{1.73_m2} (ref >59)
Glucose: 97 mg/dL (ref 65–99)
POTASSIUM: 4.6 mmol/L (ref 3.5–5.2)
SODIUM: 140 mmol/L (ref 134–144)

## 2015-12-27 LAB — LIPID PANEL
CHOLESTEROL TOTAL: 197 mg/dL (ref 100–199)
Chol/HDL Ratio: 7 ratio units — ABNORMAL HIGH (ref 0.0–5.0)
HDL: 28 mg/dL — AB (ref >39)
LDL CALC: 117 mg/dL — AB (ref 0–99)
TRIGLYCERIDES: 260 mg/dL — AB (ref 0–149)
VLDL CHOLESTEROL CAL: 52 mg/dL — AB (ref 5–40)

## 2015-12-27 LAB — TESTOSTERONE: Testosterone: 246 ng/dL — ABNORMAL LOW (ref 348–1197)

## 2015-12-28 ENCOUNTER — Other Ambulatory Visit: Payer: Self-pay | Admitting: Family Medicine

## 2015-12-28 DIAGNOSIS — R7989 Other specified abnormal findings of blood chemistry: Secondary | ICD-10-CM | POA: Insufficient documentation

## 2015-12-29 ENCOUNTER — Encounter: Payer: Self-pay | Admitting: Cardiovascular Disease

## 2015-12-29 ENCOUNTER — Ambulatory Visit (INDEPENDENT_AMBULATORY_CARE_PROVIDER_SITE_OTHER): Payer: 59 | Admitting: Cardiovascular Disease

## 2015-12-29 VITALS — BP 150/70 | HR 76 | Ht 73.0 in | Wt 353.8 lb

## 2015-12-29 DIAGNOSIS — R079 Chest pain, unspecified: Secondary | ICD-10-CM

## 2015-12-29 DIAGNOSIS — I10 Essential (primary) hypertension: Secondary | ICD-10-CM

## 2015-12-29 MED ORDER — LOSARTAN POTASSIUM 50 MG PO TABS: 50.0000 mg | ORAL_TABLET | Freq: Every day | ORAL | Status: DC

## 2015-12-29 MED ORDER — NITROGLYCERIN 0.4 MG SL SUBL: 0.4000 mg | SUBLINGUAL_TABLET | SUBLINGUAL | Status: DC | PRN

## 2015-12-29 NOTE — Patient Instructions (Signed)
Medication Instructions:  Your physician has recommended you make the following change in your medication:  1. INCREASE Losartan to 50mg  take one tablet by mouth daily 2. Continue Aspirin 3. Use Nitroglycerin 0.4mg  as needed for Chest Pain  Labwork: No new orders.   Testing/Procedures: Your physician has requested that you have an exercise stress myoview. For further information please visit [email or web address]. Please follow instruction sheet, as given.  Follow-Up: Your physician recommends that you schedule a follow-up appointment in: 1 MONTH with PA/NP   Any Other Special Instructions Will Be Listed Below (If Applicable).     If you need a refill on your cardiac medications before your next appointment, please call your pharmacy.

## 2015-12-29 NOTE — Progress Notes (Signed)
Cardiology Office Note Date:  12/29/2015   ID:  Casey Hoover, DOB 28-Oct-1980, MRN [id number]  PCP:  Kevin FentonSamuel Bradshaw, MD  Cardiologist:  Tonny Bollmanooper, Sumit Branham, MD    Chief Complaint  Patient presents with  . New Evaluation    atypical chest pain. + sob, and claudication. Denies lee     History of Present Illness: Casey Hoover is a 35 y.o. male who presents for Initial cardiac evaluation. The patient was evaluated in the emergency department 12/23/2015 with chest pain. Pain was felt to be atypical and EKG showed no significant ST changes. Troponins were negative 2. Chest wall pain was suspected. Followed up with his primary care physician formal cardiac evaluation was recommended.  He's been having 'a lot of chest pain' lately. Last week his SBP was 210 mmHg and he broke out in a 'cold sweat.' This prompted his ER visit last week.   His brother is 33 yo and has had atrial fibrillation as has his father. His father has had an MI in his 5940's. His paternal grandfather died of a 'massive heart attack' in his 4730's.   The patient is a smoker since age 35. He drinks socially. No drugs. 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   States that since the 'spell' of chest pain and diaphoresis, he has been extremely tired. He has constant pressure on his chest but when he does physical work he develops worsening of pressure in the chest. He also complains of shortness of breath with exertion, has to take 'deep breaths' to 'get his air.' Denies orthopnea, PND, syncope.    Past Medical History  Diagnosis Date  . Gout   . HTN (hypertension)   . OSA (obstructive sleep apnea) 08/04/2014    Past Surgical History  Procedure Laterality Date  . Wisdom tooth extraction  2010    Current Outpatient Prescriptions  Medication Sig Dispense Refill  . allopurinol (ZYLOPRIM) 100 MG tablet Take 100 mg by mouth daily.    Marland Kitchen. aspirin 81 MG chewable tablet Chew 324 mg by mouth once.    . COLCRYS 0.6 MG  tablet Take 0.6 mg by mouth daily. Gout flare  4  . ibuprofen (ADVIL,MOTRIN) 200 MG tablet Take 400 mg by mouth every 6 (six) hours as needed for headache.    . losartan (COZAAR) 25 MG tablet Take 1 tablet (25 mg total) by mouth daily. 30 tablet 5  . Multiple Vitamin (MULTIVITAMIN WITH MINERALS) TABS tablet Take 1 tablet by mouth daily.    . ranitidine (ZANTAC) 150 MG tablet Take 150 mg by mouth daily as needed for heartburn.     No current facility-administered medications for this visit.    Allergies:   Sulfa antibiotics   Social History:  The patient  reports that he has been smoking Cigarettes.  He has a 15 pack-year smoking history. He does not have any smokeless tobacco history on file. He reports that he drinks alcohol. He reports that he does not use illicit drugs.   Family History:  The patient's  family history includes Diabetes in his mother and other; Heart Problems in his other; Heart attack in his father; Hypertension in his father and other.    ROS:  Please see the history of present illness.  Otherwise, review of systems is positive for recent chills, orthopnea, PND, dizziness, excessive fatigue, excessive sweating, snoring, wheezing, and headaches.  All other systems are reviewed and negative.    PHYSICAL EXAM: VS:  BP 150/70  mmHg  Pulse 76  Ht  (1.854 m)  Wt 353 lb 12.8 oz (160.483 kg)  BMI 46.69 kg/m2 , BMI Body mass index is 46.69 kg/(m^2). GEN: Well nourished, well developed, obese young man in no acute distress HEENT: normal Neck: no JVD, no masses. No carotid bruits Cardiac: RRR without murmur or gallop                Respiratory:  clear to auscultation bilaterally, normal work of breathing GI: soft, nontender, nondistended, + BS MS: no deformity or atrophy Ext: no pretibial edema, pedal pulses 2+= bilaterally Skin: warm and dry, no rash Neuro:  Strength and sensation are intact Psych: euthymic mood, full affect  EKG:  EKG is ordered today. The ekg  ordered today shows NSR with LAFB, incomplete RBBB  Recent Labs: 07/11/2015: ALT 49*; TSH 1.620 12/23/2015: Hemoglobin 16.1; Platelets 211 12/26/2015: BUN 10; Creatinine, Ser 1.13; Potassium 4.6; Sodium 140   Lipid Panel     Component Value Date/Time   CHOL 197 12/26/2015 1008   TRIG 260* 12/26/2015 1008   HDL 28* 12/26/2015 1008   CHOLHDL 7.0* 12/26/2015 1008   LDLCALC 117* 12/26/2015 1008      Wt Readings from Last 3 Encounters:  12/29/15 353 lb 12.8 oz (160.483 kg)  12/26/15 357 lb 3.2 oz (162.025 kg)  12/23/15 322 lb (146.058 kg)    Cardiac Studies Reviewed: Labs, XRay, EKG reviewed from recent ER evaluation  ASSESSMENT AND PLAN: 1.  Chest pain, typical and atypical features. Certainly some characteristics of exertional angina are present. While his troponins were negative at recent evaluation, and EKG shows no acute ST changes, his symptoms are concerning in the setting of HTN, longstanding tobacco, and very strong family hx of premature CAD.   I have recommended an exercise stress Myoview for further risk stratification, with consideration of cardiac cath/PCI if stress testing is abnormal. Pt instructed to seek immediate medical attention if chest pain worsens or resting symptoms occur. He is prescribed sublingual NTG.  2. HTN, uncontrolled: Losartan increased today.  3. Hyperlipidemia: instructed on lifestyle modification. Pending CV testing, will need to consider addition of a statin. Recent lipids reviewed.   Current medicines are reviewed with the patient today.  The patient does not have concerns regarding medicines.  Labs/ tests ordered today include:  No orders of the defined types were placed in this encounter.    Disposition:   FU pending stress test result  Signed, Tonny Bollman, MD  12/29/2015 12:18 PM    [facility] [address] [place], [place], [place]  [address] Phone: [phone]; Fax: [phone]

## 2016-01-03 ENCOUNTER — Encounter: Payer: Self-pay | Admitting: Cardiovascular Disease

## 2016-01-03 ENCOUNTER — Telehealth (HOSPITAL_COMMUNITY): Payer: Self-pay | Admitting: *Deleted

## 2016-01-03 NOTE — Telephone Encounter (Signed)
Patient given detailed instructions per Myocardial Perfusion Study Information Sheet for the test on 01/08/16. Patient notified to arrive 15 minutes early and that it is imperative to arrive on time for appointment to keep from having the test rescheduled.  If you need to cancel or reschedule your appointment, please call the office within 24 hours of your appointment. Failure to do so may result in a cancellation of your appointment, and a $50 no show fee. Patient verbalized understanding.Casey FowlerMary J Smiht, RN

## 2016-01-08 ENCOUNTER — Ambulatory Visit (HOSPITAL_COMMUNITY): Payer: 59 | Attending: Cardiology

## 2016-01-08 DIAGNOSIS — R079 Chest pain, unspecified: Secondary | ICD-10-CM | POA: Insufficient documentation

## 2016-01-08 DIAGNOSIS — R61 Generalized hyperhidrosis: Secondary | ICD-10-CM | POA: Insufficient documentation

## 2016-01-08 DIAGNOSIS — I4510 Unspecified right bundle-branch block: Secondary | ICD-10-CM | POA: Diagnosis not present

## 2016-01-08 DIAGNOSIS — I10 Essential (primary) hypertension: Secondary | ICD-10-CM | POA: Insufficient documentation

## 2016-01-08 DIAGNOSIS — R0609 Other forms of dyspnea: Secondary | ICD-10-CM | POA: Insufficient documentation

## 2016-01-08 MED ORDER — TECHNETIUM TC 99M SESTAMIBI GENERIC - CARDIOLITE
33.0000 | Freq: Once | INTRAVENOUS | Status: AC | PRN
  Administered 2016-01-08: 33 via INTRAVENOUS

## 2016-01-09 ENCOUNTER — Ambulatory Visit (HOSPITAL_COMMUNITY): Payer: 59 | Attending: Cardiovascular Disease

## 2016-01-09 LAB — MYOCARDIAL PERFUSION IMAGING
CHL CUP MPHR: 188 {beats}/min
CHL CUP NUCLEAR SDS: 0
CHL CUP RESTING HR STRESS: 74 {beats}/min
CSEPED: 8 min
CSEPHR: 90 %
CSEPPHR: 169 {beats}/min
Estimated workload: 10.1 METS
Exercise duration (sec): 0 s
LHR: 0.37
LVDIAVOL: 165 mL (ref 62–150)
LVSYSVOL: 63 mL
RPE: 19
SRS: 0
SSS: 0
TID: 0.88

## 2016-01-09 MED ORDER — TECHNETIUM TC 99M SESTAMIBI GENERIC - CARDIOLITE
33.0000 | Freq: Once | INTRAVENOUS | Status: AC | PRN
  Administered 2016-01-09: 33 via INTRAVENOUS

## 2016-01-23 ENCOUNTER — Encounter (INDEPENDENT_AMBULATORY_CARE_PROVIDER_SITE_OTHER): Payer: Self-pay

## 2016-01-23 ENCOUNTER — Ambulatory Visit (INDEPENDENT_AMBULATORY_CARE_PROVIDER_SITE_OTHER): Payer: 59 | Admitting: Family Medicine

## 2016-01-23 ENCOUNTER — Encounter: Payer: Self-pay | Admitting: Family Medicine

## 2016-01-23 VITALS — BP 146/89 | HR 79 | Temp 96.9°F | Ht 73.0 in | Wt 354.8 lb

## 2016-01-23 DIAGNOSIS — R079 Chest pain, unspecified: Secondary | ICD-10-CM

## 2016-01-23 DIAGNOSIS — I10 Essential (primary) hypertension: Secondary | ICD-10-CM

## 2016-01-23 DIAGNOSIS — Z720 Tobacco use: Secondary | ICD-10-CM | POA: Diagnosis not present

## 2016-01-23 MED ORDER — MELOXICAM 15 MG PO TABS: 15.0000 mg | ORAL_TABLET | Freq: Every day | ORAL | Status: DC

## 2016-01-23 MED ORDER — LOSARTAN POTASSIUM 100 MG PO TABS: 100.0000 mg | ORAL_TABLET | Freq: Every day | ORAL | Status: DC

## 2016-01-23 NOTE — Patient Instructions (Signed)
Great to see you!  I am going to treat you for costochondritis   Try meloxicam daily for 2 weeks Also try ice 3-4 times daily for 15 minutes  Come back with any concerns  Costochondritis Costochondritis, sometimes called Tietze syndrome, is a swelling and irritation (inflammation) of the tissue (cartilage) that connects your ribs with your breastbone (sternum). It causes pain in the chest and rib area. Costochondritis usually goes away on its own over time. It can take up to 6 weeks or longer to get better, especially if you are unable to limit your activities. CAUSES  Some cases of costochondritis have no known cause. Possible causes include:  Injury (trauma).  Exercise or activity such as lifting.  Severe coughing. SIGNS AND SYMPTOMS  Pain and tenderness in the chest and rib area.  Pain that gets worse when coughing or taking deep breaths.  Pain that gets worse with specific movements. DIAGNOSIS  Your health care provider will do a physical exam and ask about your symptoms. Chest X-rays or other tests may be done to rule out other problems. TREATMENT  Costochondritis usually goes away on its own over time. Your health care provider may prescribe medicine to help relieve pain. HOME CARE INSTRUCTIONS   Avoid exhausting physical activity. Try not to strain your ribs during normal activity. This would include any activities using chest, abdominal, and side muscles, especially if heavy weights are used.  Apply ice to the affected area for the first 2 days after the pain begins.  Put ice in a plastic bag.  Place a towel between your skin and the bag.  Leave the ice on for 20 minutes, 2-3 times a day.  Only take over-the-counter or prescription medicines as directed by your health care provider. SEEK MEDICAL CARE IF:  You have redness or swelling at the rib joints. These are signs of infection.  Your pain does not go away despite rest or medicine. SEEK IMMEDIATE MEDICAL  CARE IF:   Your pain increases or you are very uncomfortable.  You have shortness of breath or difficulty breathing.  You cough up blood.  You have worse chest pains, sweating, or vomiting.  You have a fever or persistent symptoms for more than 2-3 days.  You have a fever and your symptoms suddenly get worse. MAKE SURE YOU:   Understand these instructions.  Will watch your condition.  Will get help right away if you are not doing well or get worse.   This information is not intended to replace advice given to you by your health care provider. Make sure you discuss any questions you have with your health care provider.   Document Released: 07/17/2005 Document Revised: 07/28/2013 Document Reviewed: 05/11/2013 Elsevier Interactive Patient Education [employer]2016 Elsevier Inc.

## 2016-01-23 NOTE — Progress Notes (Signed)
   HPI  Patient presents today here for follow-up chest pain and hypertension.  Chest pain Continuous central chest pain that continues after his evaluations. His recent stress Myoview was negative He states that there are no aggravating or alleviating factors, however her conversation he states that he has anxiety about climbing ladders because it seems like it may make it worse.  He's tolerating losartan easily. Good medication compliance Not checking blood pressure at home  Still smoking, considering quitting  PMH: Smoking status noted ROS: Per HPI  Objective: BP 146/89 mmHg  Pulse 79  Temp(Src) 96.9 F (36.1 C) (Oral)  Ht 6\' 1"  (1.854 m)  Wt 354 lb 12.8 oz (160.936 kg)  BMI 46.82 kg/m2 Gen: NAD, alert, cooperative with exam HEENT: NCAT CV: RRR, good S1/S2, no murmur Tenderness to palpation of bilateral costochondral joints Resp: CTABL, no wheezes, non-labored Ext: No edema, warm Neuro: Alert and oriented, No gross deficits  Assessment and plan:  # Chest pain Unclear etiology, likely at least partially costochondritis Schedule NSAIDs 2 weeks, ice Her to clinic with any worsening or failure to improve  # HTN Not at goal Increase losartan Repeat labs to monitor for bump in creatinine and potassium  # Tobacco abuse Discussed cessation, he is considering    Meds ordered this encounter  Medications  . meloxicam (MOBIC) 15 MG tablet    Sig: Take 1 tablet (15 mg total) by mouth daily.    Dispense:  14 tablet    Refill:  0  . losartan (COZAAR) 100 MG tablet    Sig: Take 1 tablet (100 mg total) by mouth daily.    Dispense:  30 tablet    Refill:  5    Murtis SinkSam Bradshaw, MD [facility] 01/23/2016, 8:33 AM

## 2016-01-24 LAB — BMP8+EGFR
BUN / CREAT RATIO: 7 — AB (ref 9–20)
BUN: 8 mg/dL (ref 6–20)
CALCIUM: 9.7 mg/dL (ref 8.7–10.2)
CO2: 23 mmol/L (ref 18–29)
Chloride: 99 mmol/L (ref 96–106)
Creatinine, Ser: 1.17 mg/dL (ref 0.76–1.27)
GFR, EST AFRICAN AMERICAN: 95 mL/min/{1.73_m2} (ref >59)
GFR, EST NON AFRICAN AMERICAN: 82 mL/min/{1.73_m2} (ref >59)
Glucose: 100 mg/dL — ABNORMAL HIGH (ref 65–99)
Potassium: 4.5 mmol/L (ref 3.5–5.2)
Sodium: 141 mmol/L (ref 134–144)

## 2016-01-31 NOTE — Progress Notes (Signed)
Cardiology Office Note:    Date:  02/01/2016   ID:  Casey Hoover, DOB 03/13/1981, MRN [id number]  PCP:  Kevin Fenton, MD  Cardiologist:  Dr. Tonny Bollman   Electrophysiologist:  n/a  Referring MD: Elenora Gamma, MD   Chief Complaint  Patient presents with  . Follow-up    Hypertension    History of Present Illness:     Casey Hoover is a 35 y.o. male with a hx of HTN, OSA, tobacco abuse.  He was evaluated by Dr. Tonny Bollman 12/29/15 for chest pain and uncontrolled HTN.  He has a FHx of AFib and CAD.  He was set up for stress Myoview. This was low risk and neg for ischemia.    Past Medical History  Diagnosis Date  . Gout   . HTN (hypertension)   . OSA (obstructive sleep apnea) 08/04/2014    Past Surgical History  Procedure Laterality Date  . Wisdom tooth extraction  2010    Current Medications: Outpatient Prescriptions Prior to Visit  Medication Sig Dispense Refill  . allopurinol (ZYLOPRIM) 100 MG tablet Take 100 mg by mouth daily.    Marland Kitchen aspirin 81 MG chewable tablet Chew 324 mg by mouth once.    . COLCRYS 0.6 MG tablet Take 0.6 mg by mouth daily. Gout flare  4  . ibuprofen (ADVIL,MOTRIN) 200 MG tablet Take 400 mg by mouth every 6 (six) hours as needed for headache.    . losartan (COZAAR) 100 MG tablet Take 1 tablet (100 mg total) by mouth daily. 30 tablet 5  . meloxicam (MOBIC) 15 MG tablet Take 1 tablet (15 mg total) by mouth daily. 14 tablet 0  . Multiple Vitamin (MULTIVITAMIN WITH MINERALS) TABS tablet Take 1 tablet by mouth daily.    . nitroGLYCERIN (NITROSTAT) 0.4 MG SL tablet Place 1 tablet (0.4 mg total) under the tongue every 5 (five) minutes as needed for chest pain. 25 tablet 3  . ranitidine (ZANTAC) 150 MG tablet Take 150 mg by mouth daily as needed for heartburn.     No facility-administered medications prior to visit.     Allergies:   Sulfa antibiotics   Social History   Social History  . Marital Status: Single    Spouse Name:  N/A  . Number of Children: N/A  . Years of Education: N/A   Occupational History  . [employer]    Social History Main Topics  . Smoking status: Current Every Day Smoker -- 1.00 packs/day for 15 years    Types: Cigarettes  . Smokeless tobacco: Not on file  . Alcohol Use: Yes     Comment: social  . Drug Use: No  . Sexual Activity: Not on file   Other Topics Concern  . Not on file   Social History Narrative     Family History:  The patient's family history includes Diabetes in his mother and other; Heart Problems in his other; Heart attack in his father; Hypertension in his father and other.   ROS:   Please see the history of present illness.    ROS All other systems reviewed and are negative.   Physical Exam:    VS:  There were no vitals taken for this visit.   GEN: Well nourished, well developed, in no acute distress HEENT: normal Neck: no JVD, no masses Cardiac: Normal S1/S2, RRR; no murmurs, rubs, or gallops, no edema;   carotid bruits,   Respiratory:  clear to auscultation bilaterally; no wheezing, rhonchi  or rales GI: soft, nontender, nondistended MS: no deformity or atrophy Skin: warm and dry Neuro: No focal deficits  Psych: Alert and oriented x 3, normal affect  Wt Readings from Last 3 Encounters:  01/23/16 354 lb 12.8 oz (160.936 kg)  01/08/16 353 lb (160.12 kg)  12/29/15 353 lb 12.8 oz (160.483 kg)      Studies/Labs Reviewed:     EKG:  EKG is  ordered today.  The ekg ordered today demonstrates   Recent Labs: 07/11/2015: ALT 49*; TSH 1.620 12/23/2015: Hemoglobin 16.1; Platelets 211 01/23/2016: BUN 8; Creatinine, Ser 1.17; Potassium 4.5; Sodium 141   Recent Lipid Panel    Component Value Date/Time   CHOL 197 12/26/2015 1008   TRIG 260* 12/26/2015 1008   HDL 28* 12/26/2015 1008   CHOLHDL 7.0* 12/26/2015 1008   LDLCALC 117* 12/26/2015 1008    Additional studies/ records that were reviewed today include:    Myoview 01/09/16 EF 62% Normal exercise  nuclear stress test with no evidence for infarct or ischemia.  Hypertensive response to stress. Normal exercise capacity.  ASSESSMENT:     No diagnosis found.  PLAN:     In order of problems listed above:  1.    Medication Adjustments/Labs and Tests Ordered: Current medicines are reviewed at length with the patient today.  Concerns regarding medicines are outlined above.  Medication changes, Labs and Tests ordered today are outlined in the Patient Instructions noted below. There are no Patient Instructions on file for this visit. Signed, Tereso NewcomerScott Venson Ferencz, PA-C  02/01/2016 8:58 AM    [facility] [address], [place], [place]  [address] Phone: [phone]; Fax: [phone]     This encounter was created in error - please disregard.

## 2016-02-01 ENCOUNTER — Encounter: Payer: 59 | Admitting: Physician Assistant

## 2016-02-14 ENCOUNTER — Encounter: Payer: Self-pay | Admitting: Physician Assistant

## 2016-03-21 IMAGING — NM NM MISC PROCEDURE
3 series · 18 of 18 positions shown · non-contrast
Comparison: none

[Series 1: wbr_s-proj_st stress_(id)_sa · 6.5mm · 6.51mm/px · 6 of 64 frames shown (1 of 2)]
[frame 6/64]
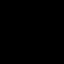
[frame 16/64]
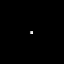
[frame 27/64]
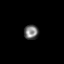
[frame 38/64]
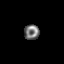
[frame 48/64]
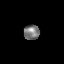
[frame 59/64]
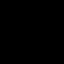

[Series 1: wbr_r-proj_st rest_(id)_sa · 6.5mm · 6.51mm/px · 6 of 64 frames shown]
[frame 6/64]
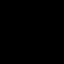
[frame 16/64]
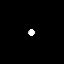
[frame 27/64]
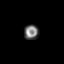
[frame 38/64]
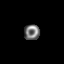
[frame 48/64]
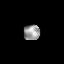
[frame 59/64]
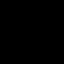

[Series 1: wbr_s-proj_st stress_(id)_sa · 6.5mm · 6.51mm/px · 6 of 512 frames shown (2 of 2)]
[frame 43/512]
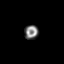
[frame 128/512]
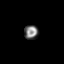
[frame 214/512]
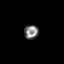
[frame 299/512]
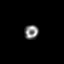
[frame 384/512]
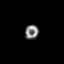
[frame 470/512]
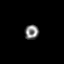

[18 of 18 positions shown; findings below may reference images not displayed]

Canned report from images found in remote index.

Refer to host system for actual result text.

## 2016-03-25 ENCOUNTER — Telehealth: Payer: Self-pay | Admitting: Family Medicine

## 2016-04-10 ENCOUNTER — Ambulatory Visit: Payer: 59 | Admitting: Physician Assistant

## 2016-04-10 ENCOUNTER — Ambulatory Visit (INDEPENDENT_AMBULATORY_CARE_PROVIDER_SITE_OTHER): Payer: 59 | Admitting: Family Medicine

## 2016-04-10 ENCOUNTER — Encounter: Payer: Self-pay | Admitting: Family Medicine

## 2016-04-10 VITALS — BP 137/86 | HR 78 | Temp 97.4°F | Ht 73.0 in | Wt 356.8 lb

## 2016-04-10 DIAGNOSIS — B029 Zoster without complications: Secondary | ICD-10-CM | POA: Diagnosis not present

## 2016-04-10 MED ORDER — PREDNISONE 50 MG PO TABS: 50.0000 mg | ORAL_TABLET | Freq: Every day | ORAL | Status: DC

## 2016-04-10 MED ORDER — VALACYCLOVIR HCL 1 G PO TABS: 1000.0000 mg | ORAL_TABLET | Freq: Three times a day (TID) | ORAL | Status: DC

## 2016-04-10 NOTE — Progress Notes (Signed)
   HPI  Patient presents today here with a painful rash.  Patient explains that about a week ago developed as a burning type pain followed by blisters and then coalesced into a large red raised area that continuously burns.  He did have chickenpox as a child.  He denies fever, chills, sweats.  He can tolerate food and fluids normally.  He has no sick contacts.  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  PMH: Smoking status noted ROS: Per HPI  Objective: BP 137/86 mmHg  Pulse 78  Temp(Src) 97.4 F (36.3 C) (Oral)  Ht 6\' 1"  (1.854 m)  Wt 356 lb 12.8 oz (161.843 kg)  BMI 47.08 kg/m2 Gen: NAD, alert, cooperative with exam HEENT: NCAT, Ion affected, bilateral eyes with normal amount of conjunctival injection that is symmetric, left upper for head around the hairline as a approximately 2-1/2 cm in diameter roughly circular erythematous raised area with heme crusting and several small areas within. CV: RRR, good S1/S2, no murmur Resp: CTABL, no wheezes, non-labored Ext: No edema, warm Neuro: Alert and oriented, No gross deficits  Assessment and plan:  # Shingles Treating with Valtrex and prednisone 3 days out of work since he cannot wear his hard hat Discussed usual course of illness, concerns for being contagious [facility] with any concerns, worsening symptoms, or failure to improve as expected.   Meds ordered this encounter  Medications  . predniSONE (DELTASONE) 50 MG tablet    Sig: Take 1 tablet (50 mg total) by mouth daily with breakfast.    Dispense:  5 tablet    Refill:  0  . valACYclovir (VALTREX) 1000 MG tablet    Sig: Take 1 tablet (1,000 mg total) by mouth 3 (three) times daily.    Dispense:  21 tablet    Refill:  0    Murtis SinkSam Willa Brocks, MD [facility] 04/10/2016, 2:08 PM

## 2016-04-10 NOTE — Patient Instructions (Signed)
Great to see you!  Shingles Shingles, which is also known as herpes zoster, is an infection that causes a painful skin rash and fluid-filled blisters. Shingles is not related to genital herpes, which is a sexually transmitted infection.   Shingles only develops in people who:  Have had chickenpox.  Have received the chickenpox vaccine. (This is rare.) CAUSES Shingles is caused by varicella-zoster virus (VZV). This is the same virus that causes chickenpox. After exposure to VZV, the virus stays in the body in an inactive (dormant) state. Shingles develops if the virus reactivates. This can happen many years after the initial exposure to VZV. It is not known what causes this virus to reactivate. RISK FACTORS People who have had chickenpox or received the chickenpox vaccine are at risk for shingles. Infection is more common in people who:  Are older than age 71.  Have a weakened defense (immune) system, such as those with HIV, AIDS, or cancer.  Are taking medicines that weaken the immune system, such as transplant medicines.  Are under great stress. SYMPTOMS Early symptoms of this condition include itching, tingling, and pain in an area on your skin. Pain may be described as burning, stabbing, or throbbing. A few days or weeks after symptoms start, a painful red rash appears, usually on one side of the body in a bandlike or beltlike pattern. The rash eventually turns into fluid-filled blisters that break open, scab over, and dry up in about 2-3 weeks. At any time during the infection, you may also develop:  A fever.  Chills.  A headache.  An upset stomach. DIAGNOSIS This condition is diagnosed with a skin exam. Sometimes, skin or fluid samples are taken from the blisters before a diagnosis is made. These samples are examined under a microscope or sent to a lab for testing. TREATMENT There is no specific cure for this condition. Your health care provider will probably prescribe  medicines to help you manage pain, recover more quickly, and avoid long-term problems. Medicines may include:  Antiviral drugs.  Anti-inflammatory drugs.  Pain medicines. If the area involved is on your face, you may be referred to a specialist, such as an eye doctor (ophthalmologist) or an ear, nose, and throat (ENT) doctor to help you avoid eye problems, chronic pain, or disability. HOME CARE INSTRUCTIONS Medicines  Take medicines only as directed by your health care provider.  Apply an anti-itch or numbing cream to the affected area as directed by your health care provider. Blister and Rash Care  Take a cool bath or apply cool compresses to the area of the rash or blisters as directed by your health care provider. This may help with pain and itching.  Keep your rash covered with a loose bandage (dressing). Wear loose-fitting clothing to help ease the pain of material rubbing against the rash.  Keep your rash and blisters clean with mild soap and cool water or as directed by your health care provider.  Check your rash every day for signs of infection. These include redness, swelling, and pain that lasts or increases.  Do not pick your blisters.  Do not scratch your rash. General Instructions  Rest as directed by your health care provider.  Keep all follow-up visits as directed by your health care provider. This is important.  Until your blisters scab over, your infection can cause chickenpox in people who have never had it or been vaccinated against it. To prevent this from happening, avoid contact with other people, especially:  Babies.  Pregnant women.  Children who have eczema.  Elderly people who have transplants.  People who have chronic illnesses, such as leukemia or AIDS. SEEK MEDICAL CARE IF:  Your pain is not relieved with prescribed medicines.  Your pain does not get better after the rash heals.  Your rash looks infected. Signs of infection include  redness, swelling, and pain that lasts or increases. SEEK IMMEDIATE MEDICAL CARE IF:  The rash is on your face or nose.  You have facial pain, pain around your eye area, or loss of feeling on one side of your face.  You have ear pain or you have ringing in your ear.  You have loss of taste.  Your condition gets worse.   This information is not intended to replace advice given to you by your health care provider. Make sure you discuss any questions you have with your health care provider.   Document Released: 10/07/2005 Document Revised: 10/28/2014 Document Reviewed: 08/18/2014 Elsevier Interactive Patient Education [employer]2016 Elsevier Inc.

## 2016-06-06 ENCOUNTER — Telehealth: Payer: Self-pay | Admitting: Family Medicine

## 2016-06-25 NOTE — Telephone Encounter (Signed)
No response from patient. Note will be filed. 

## 2016-07-12 ENCOUNTER — Ambulatory Visit (INDEPENDENT_AMBULATORY_CARE_PROVIDER_SITE_OTHER): Payer: 59 | Admitting: Family Medicine

## 2016-07-12 ENCOUNTER — Encounter: Payer: Self-pay | Admitting: Family Medicine

## 2016-07-12 VITALS — BP 179/108 | HR 81 | Temp 97.4°F | Ht 73.0 in | Wt 363.4 lb

## 2016-07-12 DIAGNOSIS — J40 Bronchitis, not specified as acute or chronic: Secondary | ICD-10-CM

## 2016-07-12 DIAGNOSIS — J329 Chronic sinusitis, unspecified: Secondary | ICD-10-CM | POA: Diagnosis not present

## 2016-07-12 MED ORDER — BETAMETHASONE SOD PHOS & ACET 6 (3-3) MG/ML IJ SUSP
6.0000 mg | Freq: Once | INTRAMUSCULAR | Status: AC
  Administered 2016-07-12: 6 mg via INTRAMUSCULAR

## 2016-07-12 MED ORDER — AMOXICILLIN-POT CLAVULANATE 875-125 MG PO TABS: 1.0000 | ORAL_TABLET | Freq: Two times a day (BID) | ORAL | 0 refills | Status: DC

## 2016-07-12 MED ORDER — PSEUDOEPHEDRINE-GUAIFENESIN ER 120-1200 MG PO TB12: 1.0000 | ORAL_TABLET | Freq: Two times a day (BID) | ORAL | 0 refills | Status: DC

## 2016-07-12 NOTE — Progress Notes (Signed)
Subjective:  Patient ID: Casey Hoover, male    DOB: April 14, 1981  Age: 35 y.o. MRN: [id number]  CC: Sinusitis (pt here today c/o sinus pressure, itchy/watery eyes, congestion, ears stopped up, sneezing and coughing and sore throat.)   HPI Stanely Sexson Violante presents for Symptoms include congestion, facial pain, nasal congestion, Subjective fever, non productive cough, post nasal drip and sinus pressure with chills, night sweats. Onset of symptoms was 2 days ago, gradually worsening since that time. Patient tried Sudafed for relief. No significant improvement noted. He is a smoker. History Osmond has a past medical history of Gout; HTN (hypertension); and OSA (obstructive sleep apnea) (08/04/2014).   He has a past surgical history that includes Wisdom tooth extraction (2010).   His family history includes Diabetes in his mother and other; Heart Problems in his other; Heart attack in his father; Hypertension in his father and other.He reports that he has been smoking Cigarettes.  He has a 15.00 pack-year smoking history. He has never used smokeless tobacco. He reports that he drinks alcohol. He reports that he does not use drugs.  Current Outpatient Prescriptions on File Prior to Visit  Medication Sig Dispense Refill  . allopurinol (ZYLOPRIM) 100 MG tablet Take 100 mg by mouth daily.    Marland Kitchen COLCRYS 0.6 MG tablet Take 0.6 mg by mouth daily. Gout flare  4  . ibuprofen (ADVIL,MOTRIN) 200 MG tablet Take 400 mg by mouth every 6 (six) hours as needed for headache.    . losartan (COZAAR) 100 MG tablet Take 1 tablet (100 mg total) by mouth daily. 30 tablet 5  . ranitidine (ZANTAC) 150 MG tablet Take 150 mg by mouth daily as needed for heartburn.    . nitroGLYCERIN (NITROSTAT) 0.4 MG SL tablet Place 1 tablet (0.4 mg total) under the tongue every 5 (five) minutes as needed for chest pain. (Patient not taking: Reported on 07/12/2016) 25 tablet 3   No current facility-administered medications on file  prior to visit.     ROS Review of Systems  Constitutional: Negative for activity change, appetite change, chills and fever.  HENT: Positive for congestion, postnasal drip, rhinorrhea and sinus pressure. Negative for ear discharge, ear pain, hearing loss, nosebleeds, sneezing and trouble swallowing.   Respiratory: Negative for chest tightness and shortness of breath.   Cardiovascular: Negative for chest pain and palpitations.  Skin: Negative for rash.    Objective:  BP (!) 179/108   Pulse 81   Temp 97.4 F (36.3 C) (Oral)   Ht 6\' 1"  (1.854 m)   Wt (!) 363 lb 6 oz (164.8 kg)   BMI 47.94 kg/m   Physical Exam  Constitutional: He appears well-developed and well-nourished.  HENT:  Head: Normocephalic and atraumatic.  Right Ear: Tympanic membrane and external ear normal. No decreased hearing is noted.  Left Ear: Tympanic membrane and external ear normal. No decreased hearing is noted.  Nose: Mucosal edema present. Right sinus exhibits no frontal sinus tenderness. Left sinus exhibits no frontal sinus tenderness.  Mouth/Throat: No oropharyngeal exudate or posterior oropharyngeal erythema.  Neck: No Brudzinski's sign noted.  Pulmonary/Chest: Breath sounds normal. No respiratory distress.  Lymphadenopathy:       Head (right side): No preauricular adenopathy present.       Head (left side): No preauricular adenopathy present.       Right cervical: No superficial cervical adenopathy present.      Left cervical: No superficial cervical adenopathy present.    Assessment & Plan:  Fayrene FearingJames was seen today for sinusitis.  Diagnoses and all orders for this visit:  Sinobronchitis -     betamethasone acetate-betamethasone sodium phosphate (CELESTONE) injection 6 mg; Inject 1 mL (6 mg total) into the muscle once.  Other orders -     amoxicillin-clavulanate (AUGMENTIN) 875-125 MG tablet; Take 1 tablet by mouth 2 (two) times daily. Take all of this medication -     Pseudoephedrine-Guaifenesin  [phone] MG TB12; Take 1 tablet by mouth 2 (two) times daily. For congestion   I have discontinued Mr. Huntley DecMabe's multivitamin with minerals, predniSONE, and valACYclovir. I am also having him start on amoxicillin-clavulanate and Pseudoephedrine-Guaifenesin. Additionally, I am having him maintain his ranitidine, ibuprofen, allopurinol, COLCRYS, nitroGLYCERIN, and losartan. We will continue to administer betamethasone acetate-betamethasone sodium phosphate.  Meds ordered this encounter  Medications  . amoxicillin-clavulanate (AUGMENTIN) 875-125 MG tablet    Sig: Take 1 tablet by mouth 2 (two) times daily. Take all of this medication    Dispense:  20 tablet    Refill:  0  . betamethasone acetate-betamethasone sodium phosphate (CELESTONE) injection 6 mg  . Pseudoephedrine-Guaifenesin [phone] MG TB12    Sig: Take 1 tablet by mouth 2 (two) times daily. For congestion    Dispense:  20 each    Refill:  0   Stop smoking.  Follow-up: Return if symptoms worsen or fail to improve.  Mechele ClaudeWarren Lupe Bonner, M.D.

## 2016-10-22 ENCOUNTER — Ambulatory Visit (INDEPENDENT_AMBULATORY_CARE_PROVIDER_SITE_OTHER): Payer: 59 | Admitting: Family Medicine

## 2016-10-22 ENCOUNTER — Encounter: Payer: Self-pay | Admitting: Family Medicine

## 2016-10-22 ENCOUNTER — Ambulatory Visit (INDEPENDENT_AMBULATORY_CARE_PROVIDER_SITE_OTHER): Payer: 59

## 2016-10-22 VITALS — BP 158/90 | HR 102 | Temp 97.6°F | Ht 73.0 in | Wt 374.0 lb

## 2016-10-22 DIAGNOSIS — R0602 Shortness of breath: Secondary | ICD-10-CM

## 2016-10-22 MED ORDER — AMOXICILLIN-POT CLAVULANATE 875-125 MG PO TABS: 1.0000 | ORAL_TABLET | Freq: Two times a day (BID) | ORAL | 0 refills | Status: DC

## 2016-10-22 NOTE — Progress Notes (Signed)
Subjective:  Patient ID: Casey Hoover, male    DOB: September 05, 1981  Age: 36 y.o. MRN: [id number]  CC: Cough (pt here today c/o cough and mid back pain, was in ER last night and they did EKG and told pt he was having panic attacks, and he had wanted a chest x-ray and labs but they didn't do it)   HPI Casey Hoover presents for 10 days of cough. Became dyspneic and felt chest tightness so went to E.D> last night. W/U for chest pain performed, negative. He is a smoker he has a nonproductive cough. When he lays down at night for the last several days he feels dyspneic and then becomes anxious. Sx escalating over last 3-4 days. Dx with panic, but no explanation for cough. No fever or chills.    History Tymarion has a past medical history of Gout; HTN (hypertension); and OSA (obstructive sleep apnea) (08/04/2014).   He has a past surgical history that includes Wisdom tooth extraction (2010).   His family history includes Diabetes in his mother and other; Heart Problems in his other; Heart attack in his father; Hypertension in his father and other.He reports that he has been smoking Cigarettes.  He has a 15.00 pack-year smoking history. He has never used smokeless tobacco. He reports that he drinks alcohol. He reports that he does not use drugs.    ROS Review of Systems  Constitutional: Negative for activity change, appetite change, chills and fever.  HENT: Negative for congestion, ear discharge, ear pain, hearing loss, nosebleeds, postnasal drip, rhinorrhea, sinus pressure, sneezing and trouble swallowing.   Respiratory: Positive for cough, chest tightness and shortness of breath.   Cardiovascular: Negative for palpitations and leg swelling.  Musculoskeletal: Negative for arthralgias and myalgias.  Skin: Negative for color change and rash.    Objective:  BP (!) 158/90   Pulse (!) 102   Temp 97.6 F (36.4 C) (Oral)   Ht 6' 1" (1.854 m)   Wt (!) 374 lb (169.6 kg)   BMI 49.34 kg/m     BP Readings from Last 3 Encounters:  10/22/16 (!) 158/90  07/12/16 (!) 179/108  04/10/16 137/86    Wt Readings from Last 3 Encounters:  10/22/16 (!) 374 lb (169.6 kg)  07/12/16 (!) 363 lb 6 oz (164.8 kg)  04/10/16 (!) 356 lb 12.8 oz (161.8 kg)     Physical Exam  Constitutional: He appears well-developed and well-nourished.  HENT:  Head: Normocephalic and atraumatic.  Right Ear: Tympanic membrane and external ear normal. No decreased hearing is noted.  Left Ear: Tympanic membrane and external ear normal. No decreased hearing is noted.  Mouth/Throat: No oropharyngeal exudate or posterior oropharyngeal erythema.  Eyes: Pupils are equal, round, and reactive to light.  Neck: Normal range of motion. Neck supple.  Cardiovascular: Normal rate and regular rhythm.   No murmur heard. Pulmonary/Chest: No respiratory distress. He has wheezes.  Abdominal: Soft. Bowel sounds are normal. He exhibits no mass. There is no tenderness.  Vitals reviewed.    Lab Results  Component Value Date   WBC 10.6 (H) 12/23/2015   HGB 16.1 12/23/2015   HCT 48.4 12/23/2015   PLT 211 12/23/2015   GLUCOSE 100 (H) 01/23/2016   CHOL 197 12/26/2015   TRIG 260 (H) 12/26/2015   HDL 28 (L) 12/26/2015   LDLCALC 117 (H) 12/26/2015   ALT 49 (H) 07/11/2015   AST 18 07/11/2015   NA 141 01/23/2016   K 4.5 01/23/2016  CL 99 01/23/2016   CREATININE 1.17 01/23/2016   BUN 8 01/23/2016   CO2 23 01/23/2016   TSH 1.620 07/11/2015    Dg Chest 2 View  Possible RUL inifiltrate - low quality image  Assessment & Plan:   Aydrien was seen today for cough.  Diagnoses and all orders for this visit:  Shortness of breath -     DG Chest 2 View; Future -     CBC with Differential/Platelet -     CMP14+EGFR -     D-dimer, quantitative (not at [facility])  Other orders -     amoxicillin-clavulanate (AUGMENTIN) 875-125 MG tablet; Take 1 tablet by mouth 2 (two) times daily. Take all of this medication  I have discontinued  Mr. Kurdziel's amoxicillin-clavulanate and Pseudoephedrine-Guaifenesin. I am also having him start on amoxicillin-clavulanate. Additionally, I am having him maintain his ranitidine, ibuprofen, allopurinol, COLCRYS, nitroGLYCERIN, and losartan.  Meds ordered this encounter  Medications  . amoxicillin-clavulanate (AUGMENTIN) 875-125 MG tablet    Sig: Take 1 tablet by mouth 2 (two) times daily. Take all of this medication    Dispense:  20 tablet    Refill:  0     Follow-up: Return in about 4 weeks (around 11/19/2016), or if symptoms worsen or fail to improve, for hypertension.  Claretta Fraise, M.D.

## 2016-10-23 ENCOUNTER — Telehealth: Payer: Self-pay | Admitting: Family Medicine

## 2016-10-23 ENCOUNTER — Other Ambulatory Visit: Payer: Self-pay | Admitting: Family Medicine

## 2016-10-23 LAB — CBC WITH DIFFERENTIAL/PLATELET
Basophils Absolute: 0.1 10*3/uL (ref 0.0–0.2)
Basos: 0 %
EOS (ABSOLUTE): 0.2 10*3/uL (ref 0.0–0.4)
EOS: 1 %
HEMATOCRIT: 49.8 % (ref 37.5–51.0)
Hemoglobin: 17.1 g/dL (ref 13.0–17.7)
IMMATURE GRANS (ABS): 0.1 10*3/uL (ref 0.0–0.1)
IMMATURE GRANULOCYTES: 1 %
LYMPHS: 16 %
Lymphocytes Absolute: 2.2 10*3/uL (ref 0.7–3.1)
MCH: 30.3 pg (ref 26.6–33.0)
MCHC: 34.3 g/dL (ref 31.5–35.7)
MCV: 88 fL (ref 79–97)
Monocytes Absolute: 1.3 10*3/uL — ABNORMAL HIGH (ref 0.1–0.9)
Monocytes: 10 %
NEUTROS PCT: 72 %
Neutrophils Absolute: 9.6 10*3/uL — ABNORMAL HIGH (ref 1.4–7.0)
PLATELETS: 266 10*3/uL (ref 150–379)
RBC: 5.64 x10E6/uL (ref 4.14–5.80)
RDW: 13.2 % (ref 12.3–15.4)
WBC: 13.5 10*3/uL — ABNORMAL HIGH (ref 3.4–10.8)

## 2016-10-23 LAB — CMP14+EGFR
A/G RATIO: 2.2 (ref 1.2–2.2)
ALT: 56 IU/L — ABNORMAL HIGH (ref 0–44)
AST: 25 IU/L (ref 0–40)
Albumin: 4.9 g/dL (ref 3.5–5.5)
Alkaline Phosphatase: 101 IU/L (ref 39–117)
BUN/Creatinine Ratio: 11 (ref 9–20)
BUN: 13 mg/dL (ref 6–20)
Bilirubin Total: 0.5 mg/dL (ref 0.0–1.2)
CALCIUM: 9.7 mg/dL (ref 8.7–10.2)
CO2: 26 mmol/L (ref 18–29)
CREATININE: 1.17 mg/dL (ref 0.76–1.27)
Chloride: 97 mmol/L (ref 96–106)
GFR, EST AFRICAN AMERICAN: 94 mL/min/{1.73_m2} (ref >59)
GFR, EST NON AFRICAN AMERICAN: 81 mL/min/{1.73_m2} (ref >59)
Globulin, Total: 2.2 g/dL (ref 1.5–4.5)
Glucose: 87 mg/dL (ref 65–99)
POTASSIUM: 4.6 mmol/L (ref 3.5–5.2)
Sodium: 140 mmol/L (ref 134–144)
TOTAL PROTEIN: 7.1 g/dL (ref 6.0–8.5)

## 2016-10-23 LAB — D-DIMER, QUANTITATIVE: D-DIMER: 0.36 mg/L FEU (ref 0.00–0.49)

## 2016-10-23 MED ORDER — BENZONATATE 200 MG PO CAPS: 200.0000 mg | ORAL_CAPSULE | Freq: Three times a day (TID) | ORAL | 0 refills | Status: DC | PRN

## 2016-10-23 NOTE — Telephone Encounter (Signed)
Please contact the patient _Off work through the weekend

## 2016-10-23 NOTE — Telephone Encounter (Signed)
Please advise on note and cough medication .

## 2016-10-23 NOTE — Telephone Encounter (Signed)
Aware of new script. 

## 2016-10-23 NOTE — Telephone Encounter (Signed)
Please review and advise.

## 2016-10-23 NOTE — Progress Notes (Signed)
Your chest x-ray looked normal. Blood work showed no sign of clots. Thanks, WS.

## 2016-10-23 NOTE — Telephone Encounter (Signed)
I sent in the requested prescription 

## 2017-01-03 IMAGING — DX DG CHEST 2V
3 series · 3 of 3 positions shown · non-contrast
Comparison: PA and lateral chest x-ray December 23, 2015

CLINICAL DATA: Chest pain, shortness of breath, cough and chest
congestion for the past 10 days. History of sleep apnea, obesity,
current smoker.

EXAM:
CHEST  2 VIEW

[chest pa (1 of 2)]
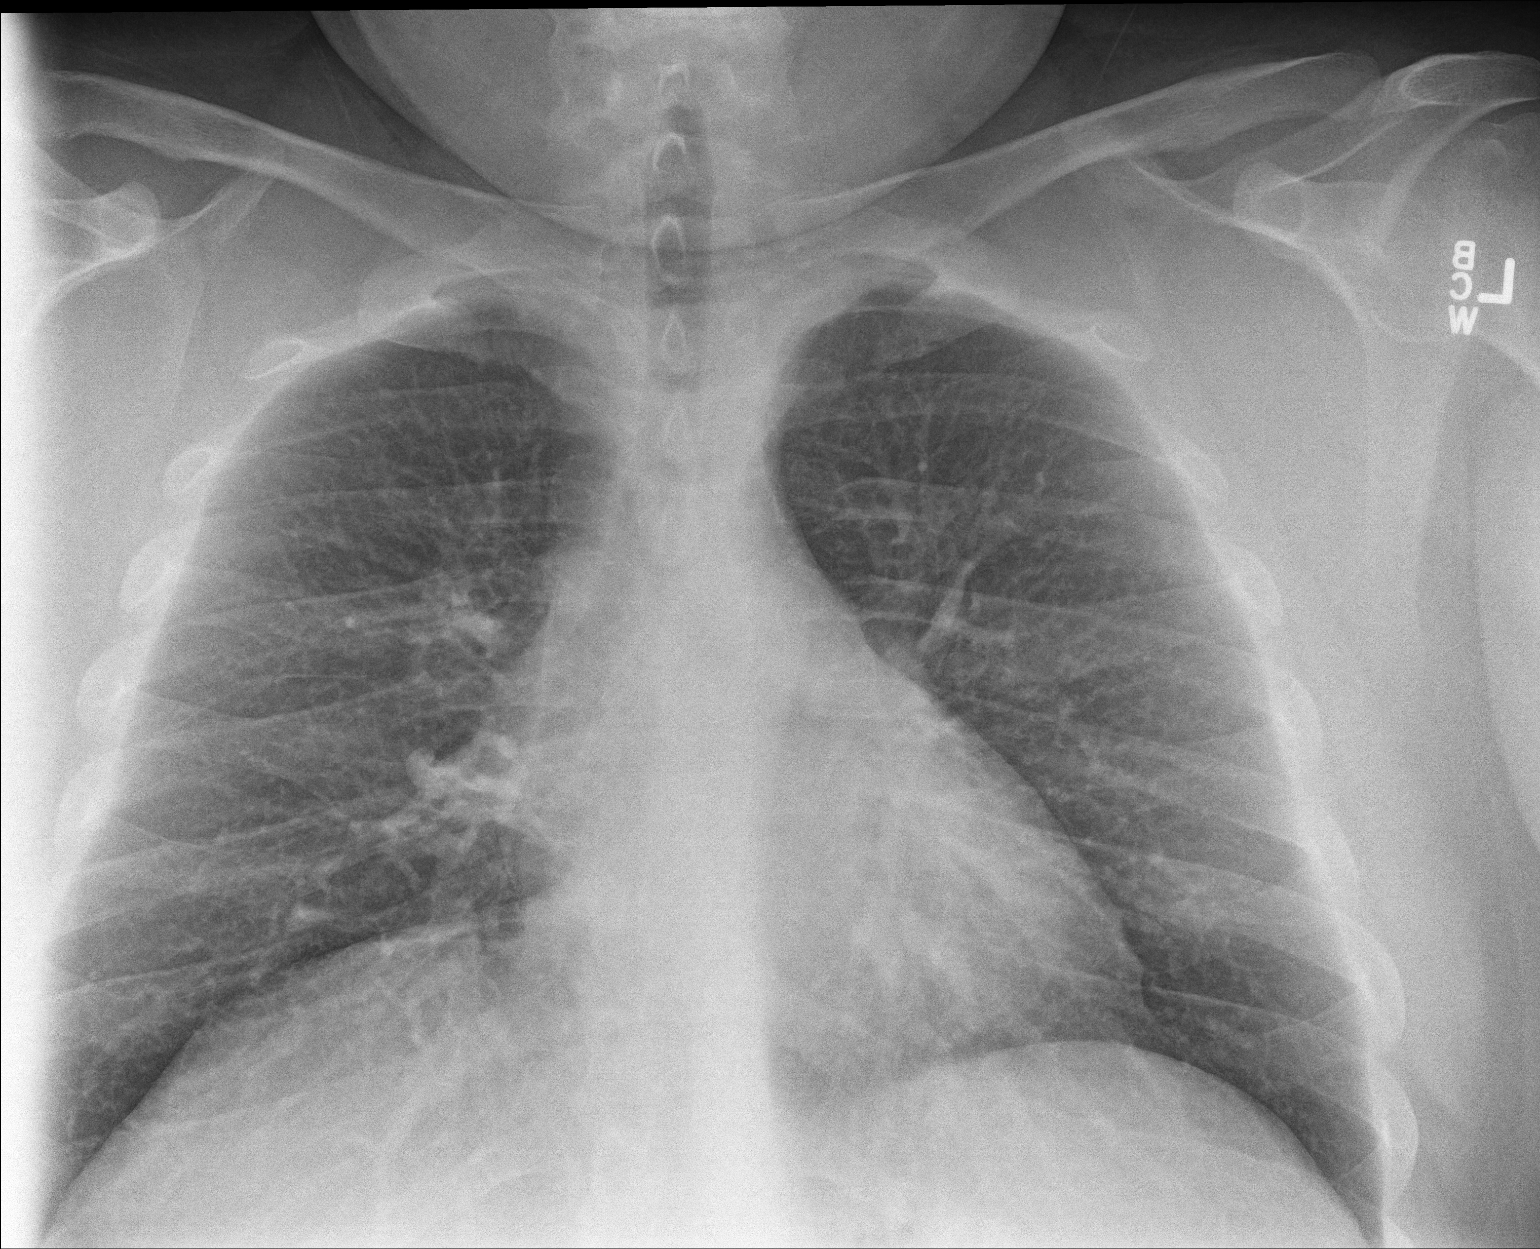

[chest pa (2 of 2)]
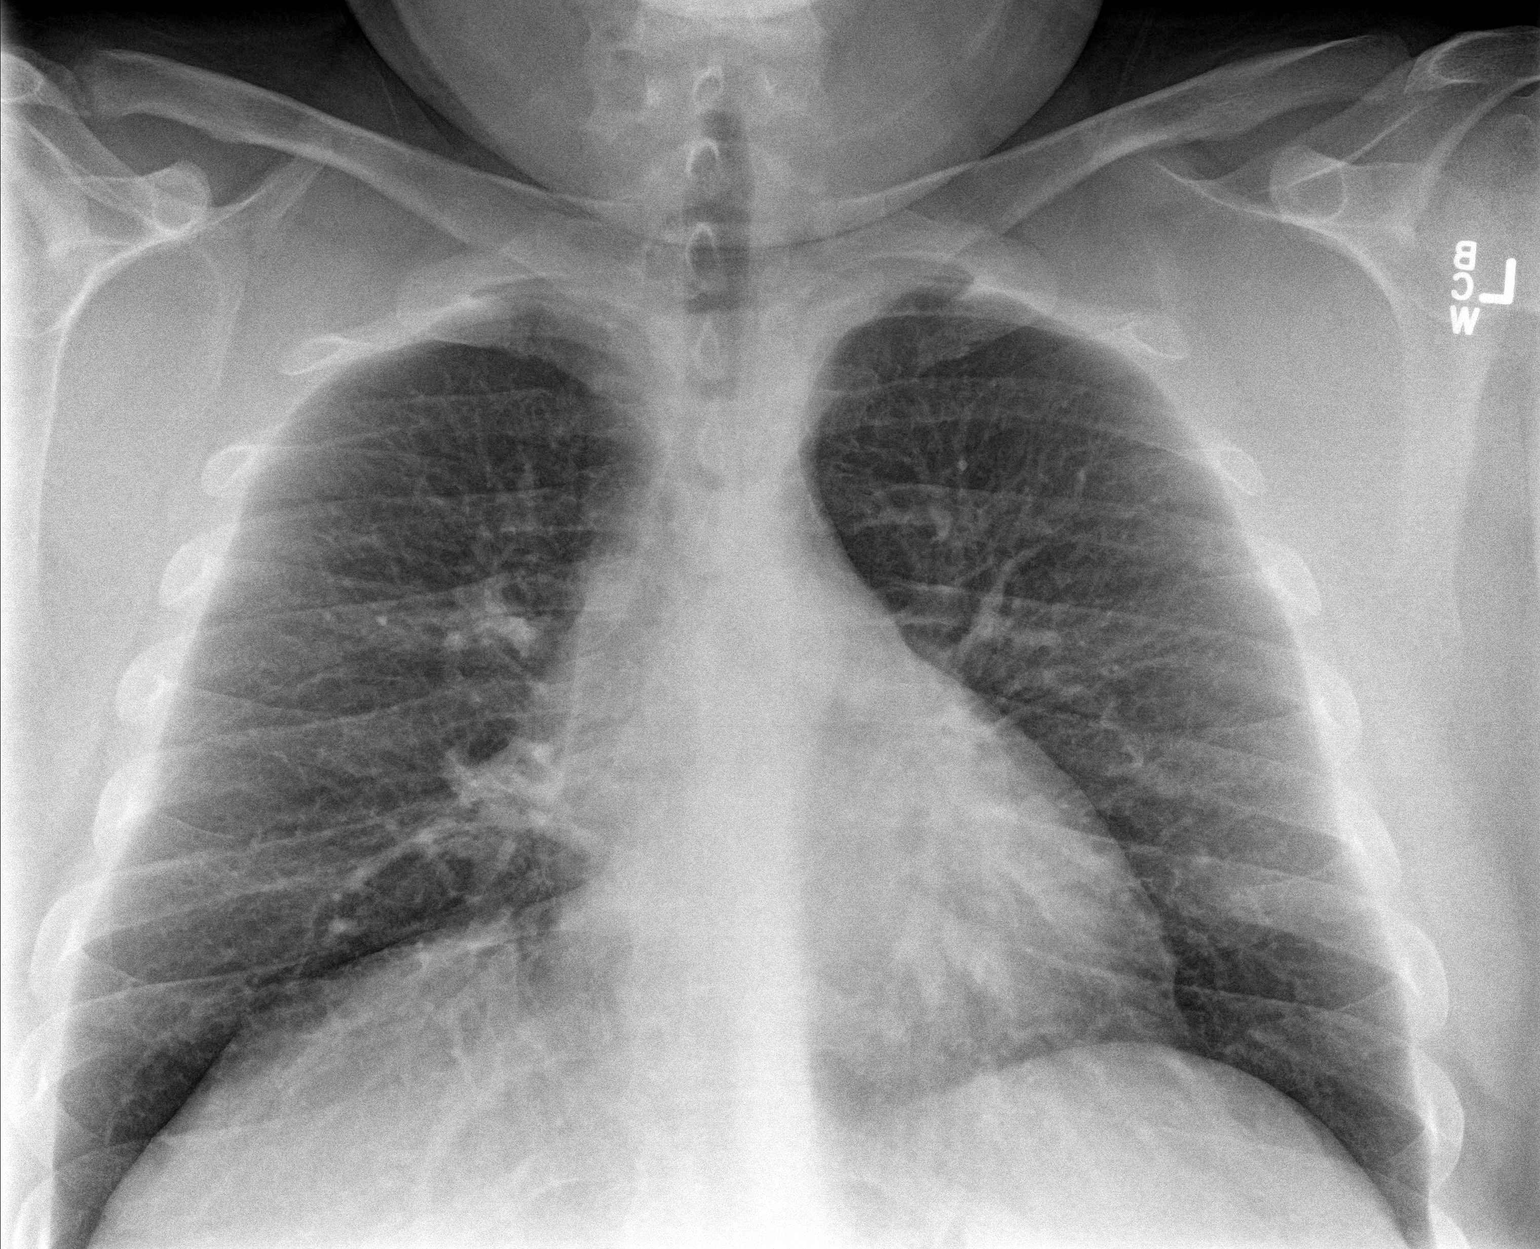

[chest lat]
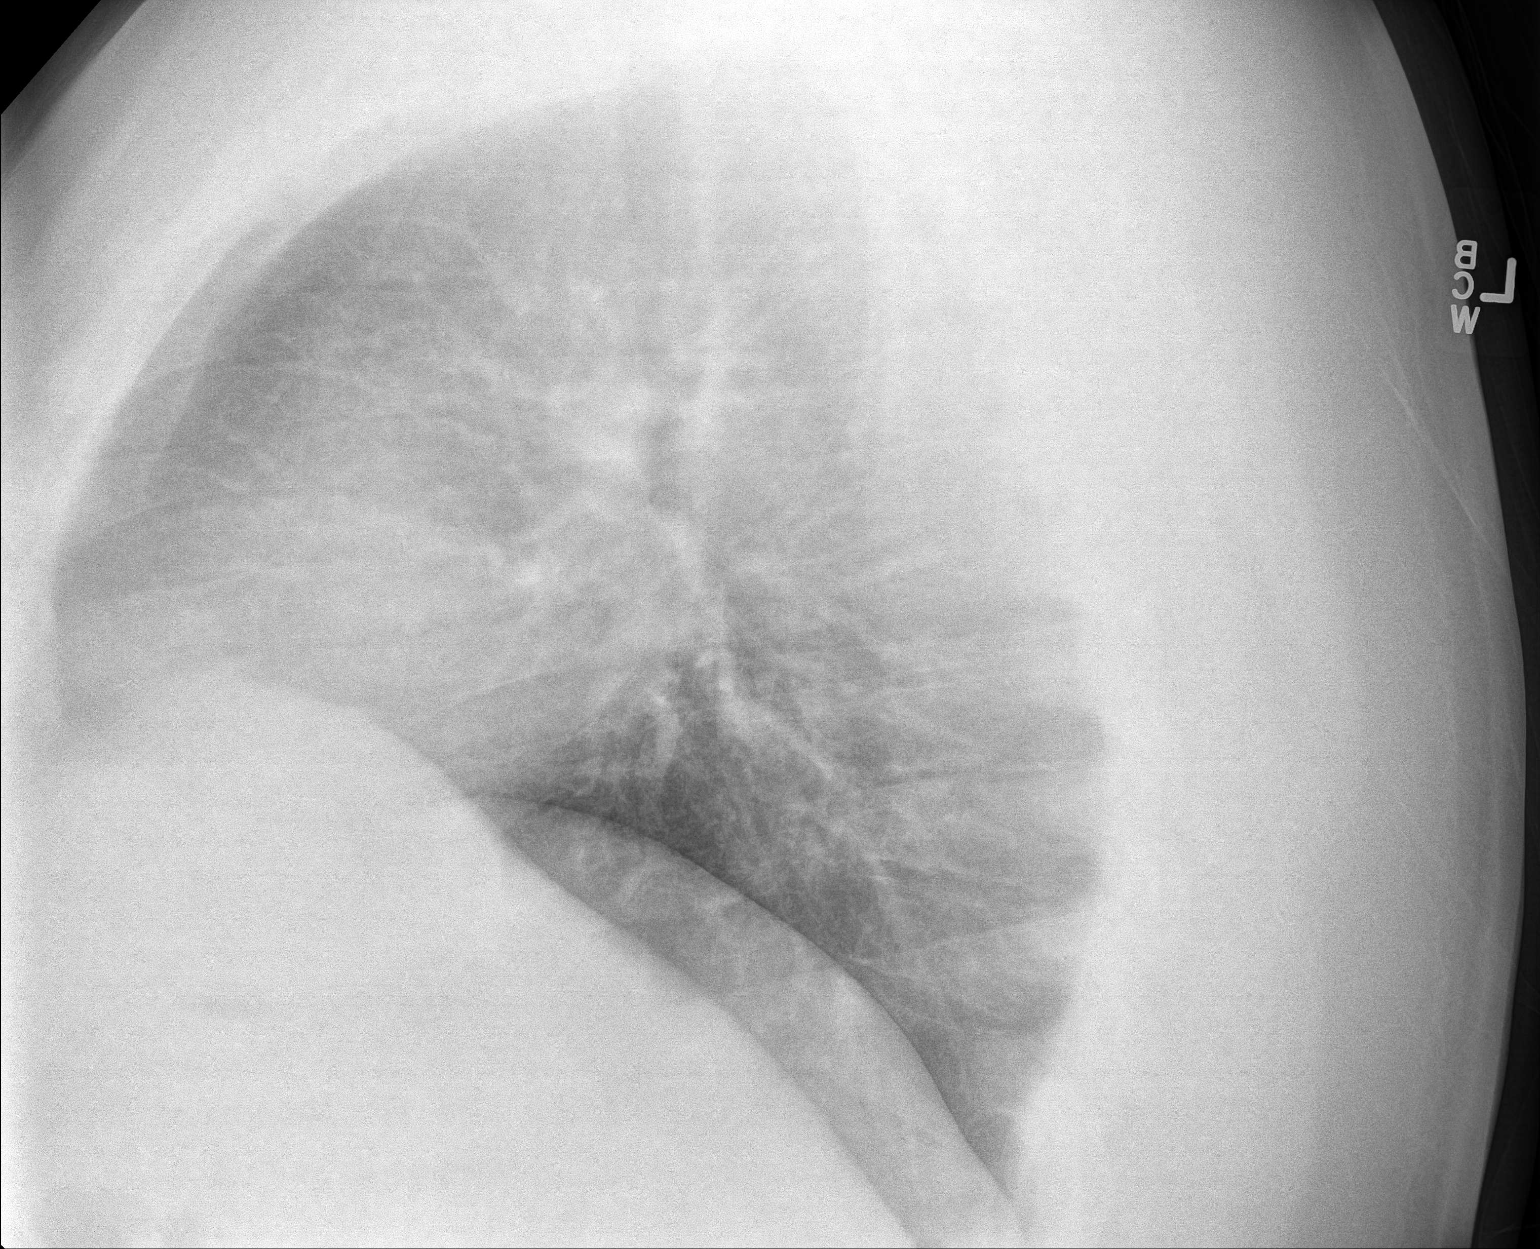

[3 of 3 positions shown; findings below may reference images not displayed]

FINDINGS: The lungs are adequately inflated. There is no focal infiltrate. The
interstitial markings are coarse. There is no pleural effusion or
pneumothorax. The trachea is midline. The heart and pulmonary
vascularity are unremarkable.
IMPRESSION: Mild stable interstitial prominence likely reflecting the patient's
smoking history. No alveolar pneumonia nor CHF.

## 2017-02-25 ENCOUNTER — Ambulatory Visit (INDEPENDENT_AMBULATORY_CARE_PROVIDER_SITE_OTHER): Payer: 59

## 2017-02-25 ENCOUNTER — Ambulatory Visit (INDEPENDENT_AMBULATORY_CARE_PROVIDER_SITE_OTHER): Payer: 59 | Admitting: Family Medicine

## 2017-02-25 ENCOUNTER — Encounter: Payer: Self-pay | Admitting: Family Medicine

## 2017-02-25 VITALS — BP 135/80 | HR 83 | Temp 97.5°F | Ht 73.0 in | Wt 368.0 lb

## 2017-02-25 DIAGNOSIS — M7541 Impingement syndrome of right shoulder: Secondary | ICD-10-CM

## 2017-02-25 MED ORDER — BETAMETHASONE SOD PHOS & ACET 6 (3-3) MG/ML IJ SUSP
6.0000 mg | Freq: Once | INTRAMUSCULAR | Status: AC
  Administered 2017-02-25: 6 mg via INTRAMUSCULAR

## 2017-02-25 MED ORDER — PREDNISONE 10 MG PO TABS: ORAL_TABLET | ORAL | 0 refills | Status: DC

## 2017-02-25 NOTE — Progress Notes (Signed)
Subjective:  Patient ID: Casey Hoover, male    DOB: [birth year]  Age: 36 y.o. MRN: [id number]  CC: Shoulder Pain (pt here today c/o right shoulder pain for 2-3 months )   HPI Casey Hoover presents for Up to 6 months of increasing pain in the right shoulder. It has gotten to the point of being unbearable for him. Casey Hoover can't roll over at night without waking up. 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 The shoveling motion is painful. Casey Hoover denies any known injury. Pain is worst with raising his arm for abduction but also for external rotation.  History Casey Hoover has a past medical history of Gout; HTN (hypertension); and OSA (obstructive sleep apnea) (08/04/2014).   Casey Hoover has a past surgical history that includes Wisdom tooth extraction (2010).   His family history includes Diabetes in his mother and other; Heart Problems in his other; Heart attack in his father; Hypertension in his father and other.Casey Hoover reports that Casey Hoover has been smoking Cigarettes.  Casey Hoover has a 15.00 pack-year smoking history. Casey Hoover has never used smokeless tobacco. Casey Hoover reports that Casey Hoover drinks alcohol. Casey Hoover reports that Casey Hoover does not use drugs.  Current Outpatient Prescriptions on File Prior to Visit  Medication Sig Dispense Refill  . allopurinol (ZYLOPRIM) 100 MG tablet Take 100 mg by mouth daily.    Marland Kitchen COLCRYS 0.6 MG tablet Take 0.6 mg by mouth daily. Gout flare  4  . ibuprofen (ADVIL,MOTRIN) 200 MG tablet Take 400 mg by mouth every 6 (six) hours as needed for headache.    . losartan (COZAAR) 100 MG tablet Take 1 tablet (100 mg total) by mouth daily. 30 tablet 5  . ranitidine (ZANTAC) 150 MG tablet Take 150 mg by mouth daily as needed for heartburn.    . nitroGLYCERIN (NITROSTAT) 0.4 MG SL tablet Place 1 tablet (0.4 mg total) under the tongue every 5 (five) minutes as needed for chest pain. (Patient not taking: Reported on 10/22/2016) 25 tablet 3   No current facility-administered medications  on file prior to visit.     ROS Review of Systems  Constitutional: Positive for activity change.  Musculoskeletal: Positive for arthralgias and myalgias. Negative for joint swelling and neck pain.  Hematological: Does not bruise/bleed easily.    Objective:  BP 135/80   Pulse 83   Temp 97.5 F (36.4 C) (Oral)   Ht 6\' 1"  (1.854 m)   Wt (!) 368 lb (166.9 kg)   BMI 48.55 kg/m   Physical Exam  Constitutional: Casey Hoover is oriented to person, place, and time. Casey Hoover appears well-developed and well-nourished. No distress.  Neck: Normal range of motion. Neck supple.  Pulmonary/Chest: Effort normal.  Musculoskeletal: Casey Hoover exhibits tenderness.  There is exquisite tenderness with abduction above about 45 at the right shoulder. There is tenderness for palpation at the superior shoulder posterior to the superior trapezius border. There is inability to rotate externally the abducted shoulder past 20. The neurovascular system is intact for the right upper extremity area that his pulses are normal and sensation is normal for light touch.  Neurological: Casey Hoover is alert and oriented to person, place, and time. Casey Hoover exhibits normal muscle tone. Coordination normal.  Skin: Skin is warm and dry. No erythema.  Psychiatric: Casey Hoover has a normal mood and affect.    Assessment & Plan:   Dinero was seen today for shoulder pain.  Diagnoses and all orders for this visit:  Rotator cuff impingement syndrome  of right shoulder -     Ambulatory referral to Physical Therapy -     betamethasone acetate-betamethasone sodium phosphate (CELESTONE) injection 6 mg; Inject 1 mL (6 mg total) into the muscle once. -     MR SHOULDER RIGHT WO CONTRAST; Future -     DG Shoulder Right; Future  Other orders -     predniSONE (DELTASONE) 10 MG tablet; Take 5 daily for 3 days followed by 4,3,2 and 1 for 3 days each.   I have discontinued Mr. Angelillo's amoxicillin-clavulanate and benzonatate. I am also having him start on predniSONE.  Additionally, I am having him maintain his ranitidine, ibuprofen, allopurinol, COLCRYS, nitroGLYCERIN, and losartan. We will continue to administer betamethasone acetate-betamethasone sodium phosphate.  Meds ordered this encounter  Medications  . predniSONE (DELTASONE) 10 MG tablet    Sig: Take 5 daily for 3 days followed by 4,3,2 and 1 for 3 days each.    Dispense:  45 tablet    Refill:  0  . betamethasone acetate-betamethasone sodium phosphate (CELESTONE) injection 6 mg     Follow-up: Return in about 2 weeks (around 03/11/2017).  Mechele ClaudeWarren Bryauna Byrum, M.D.

## 2017-03-03 ENCOUNTER — Telehealth: Payer: Self-pay | Admitting: Family Medicine

## 2017-03-03 ENCOUNTER — Ambulatory Visit: Payer: 59 | Attending: Family Medicine | Admitting: Physical Therapy

## 2017-03-03 DIAGNOSIS — M6281 Muscle weakness (generalized): Secondary | ICD-10-CM | POA: Insufficient documentation

## 2017-03-03 DIAGNOSIS — G8929 Other chronic pain: Secondary | ICD-10-CM | POA: Insufficient documentation

## 2017-03-03 DIAGNOSIS — M25611 Stiffness of right shoulder, not elsewhere classified: Secondary | ICD-10-CM | POA: Diagnosis present

## 2017-03-03 DIAGNOSIS — M25511 Pain in right shoulder: Secondary | ICD-10-CM | POA: Diagnosis present

## 2017-03-03 NOTE — Patient Instructions (Signed)
Instructed patient in Ice massage to right shoulder.

## 2017-03-03 NOTE — Therapy (Signed)
[facility] [address] [place], [place], [address] Phone: [phone]   Fax:  [phone]  Physical Therapy Evaluation  Patient Details  Name: Casey MeyerJames Michael Hoover MRN: [id number] Date of Birth: October 12, 1981 Referring Provider: Mechele ClaudeWarren Stacks MD.  Encounter Date: 03/03/2017      PT End of Session - 03/03/17 1020    Visit Number 1   Number of Visits 12   Date for PT Re-Evaluation 04/14/17   PT Start Time 0902   PT Stop Time 0954   PT Time Calculation (min) 52 min   Activity Tolerance Patient tolerated treatment well   Behavior During Therapy [facility] for tasks assessed/performed      Past Medical History:  Diagnosis Date  . Gout   . HTN (hypertension)   . OSA (obstructive sleep apnea) 08/04/2014    Past Surgical History:  Procedure Laterality Date  . WISDOM TOOTH EXTRACTION  2010    There were no vitals filed for this visit.       Subjective Assessment - 03/03/17 0955    Subjective The patient reports ongoing and worsening right shoulder pain over the last 6 months+.  His resting pain-level is a 3-4/10 today.  He cannot lie on his right shoulder to sleep.  Cdertain movements produce severe (10/10) pain.  Rest decreases his pain.  He is currently on a regimen of Prednisone.  He had an X-ray which was normal.   Patient Stated Goals Use right UE wihtout pain.   Currently in Pain? Yes   Pain Score 4    Pain Location Shoulder   Pain Orientation Right   Pain Descriptors / Indicators Aching;Throbbing   Pain Type Chronic pain   Pain Onset More than a month ago   Pain Frequency Constant   Aggravating Factors  See above.   Pain Relieving Factors See above.            [facility] Assessment - 03/03/17 0001      Assessment   Medical Diagnosis Rotator cuff syndroem of right shoulder.   Referring Provider Mechele ClaudeWarren Stacks MD.     Precautions   Precautions None     Restrictions   Weight Bearing Restrictions No     Balance Screen    Has the patient fallen in the past 6 months No   Has the patient had a decrease in activity level because of a fear of falling?  No   Is the patient reluctant to leave their home because of a fear of falling?  No     Home Environment   Living Environment Private residence     Prior Function   Level of Independence Independent     Posture/Postural Control   Posture/Postural Control Postural limitations   Postural Limitations Rounded Shoulders;Forward head   Posture Comments --     ROM / Strength   AROM / PROM / Strength AROM;Strength     AROM   Overall AROM Comments Slow and painful but full right shoulder flexion; ER= 75 degrees and behind back to sacrum.     Strength   Overall Strength Comments Rigth shoulder abduction= 4-/5; ER/IR= 4-/5 which appears limited by pain.     Palpation   Palpation comment Temder to palpation at right shoulder acromial ridge and infraspinatus especially near the humeral attachment.     Special Tests    Special Tests Rotator Cuff Impingement   Rotator Cuff Impingment tests Neer impingement test;Hawkins- Kyung RuddKennedy test;Empty Can test     Neer  Impingement test    Findings Positive   Side Right     Hawkins-Kennedy test   Findings Positive   Side Right     Empty Can test   Findings Negative   Side Right     Ambulation/Gait   Gait Comments WNL.                   [facility] Adult PT Treatment/Exercise - 03/03/17 0001      Modalities   Modalities Electrical Stimulation     Electrical Stimulation   Electrical Stimulation Location Right shoulder.   Electrical Stimulation Action IFC   Electrical Stimulation Parameters 80-150 Hz x 20 minutes.   Electrical Stimulation Goals Pain                     PT Long Term Goals - 03/03/17 1018      PT LONG TERM GOAL #1   Title Independent with a HEP.   Time 6   Period Weeks   Status New     PT LONG TERM GOAL #2   Title Active shoulder flexion to 155 degrees so the patient  can easily reach overhead with pain not > 2/10.   Time 6   Period Weeks   Status New     PT LONG TERM GOAL #3   Title Active ER to 80 degrees+ to allow for easily donning/doffing of apparel   Time 6   Period Weeks   Status New     PT LONG TERM GOAL #4   Title Increase shoulder strength to a solid 5/5 to increase stability for performance of functional activities   Time 6   Period Weeks   Status New     PT LONG TERM GOAL #5   Title Perform ADL's with pain not > 2/10.   Time 6   Period Weeks   Status New               Plan - 03/03/17 1012    Clinical Impression Statement The patient presents to OPPT with c/o at time severe right shoulder pain with certain movements.  He has limitations of right shoulder range of motion and strength that impair his ability to perform ADL's and work activites.  He is tender to palpation at his right acromial ridge and Infraspinatus.  Patient will benefit from skilled physicalt herapy.   Rehab Potential Excellent   PT Frequency 2x / week   PT Duration 6 weeks   PT Treatment/Interventions ADLs/Self Care Home Management;Cryotherapy;Electrical Stimulation;Moist Heat;Ultrasound;Therapeutic exercise;Therapeutic activities;Patient/family education;Manual techniques;Vasopneumatic Device;Dry needling;Passive range of motion   PT Next Visit Plan Combo e'stim/U/S to affected right shoulder acromial ridge and Infraspinatus f/b STW/M.  UE Ranger and wall climbs and gentle doorway stretch to increase ER.  Start with low-level yellow theraband RW4 then progress to PRE's.  Electrial stimulation.   Consulted and Agree with Plan of Care Patient      Patient will benefit from skilled therapeutic intervention in order to improve the following deficits and impairments:  Decreased activity tolerance, Pain, Decreased strength, Decreased range of motion  Visit Diagnosis: Chronic right shoulder pain - Plan: PT plan of care cert/re-cert  Stiffness of right  shoulder, not elsewhere classified - Plan: PT plan of care cert/re-cert  Muscle weakness (generalized) - Plan: PT plan of care cert/re-cert     Problem List Patient Active Problem List   Diagnosis Date Noted  . Low testosterone 12/28/2015  . Tobacco abuse 12/26/2015  .  HTN (hypertension) 07/11/2015  . Obesity 07/11/2015  . Fatigue 07/11/2015  . OSA (obstructive sleep apnea) 08/04/2014  . Gout 03/07/2014    Casey Hoover, Casey Hoover 03/03/2017, 10:31 AM  [facility] [address] [place], [place], [address] Phone: [phone]   Fax:  [phone]  Name: Casey Hoover MRN: [id number] Date of Birth: 1980/11/26

## 2017-03-05 ENCOUNTER — Ambulatory Visit: Payer: 59 | Admitting: Physical Therapy

## 2017-03-05 ENCOUNTER — Encounter: Payer: Self-pay | Admitting: Physical Therapy

## 2017-03-05 DIAGNOSIS — G8929 Other chronic pain: Secondary | ICD-10-CM

## 2017-03-05 DIAGNOSIS — M25611 Stiffness of right shoulder, not elsewhere classified: Secondary | ICD-10-CM

## 2017-03-05 DIAGNOSIS — M25511 Pain in right shoulder: Secondary | ICD-10-CM | POA: Diagnosis not present

## 2017-03-05 DIAGNOSIS — M6281 Muscle weakness (generalized): Secondary | ICD-10-CM

## 2017-03-05 NOTE — Therapy (Signed)
[facility] [address] [place], [place], [address] Phone: [phone]   Fax:  [phone]  Physical Therapy Treatment  Patient Details  Name: Casey Hoover MRN: [id number] Date of Birth: 27-Jun-1981 Referring Provider: Mechele Claude MD.  Encounter Date: 03/05/2017      PT End of Session - 03/05/17 0859    Visit Number 2   Number of Visits 12   Date for PT Re-Evaluation 04/14/17   PT Start Time 0814   PT Stop Time 0858   PT Time Calculation (min) 44 min   Activity Tolerance Patient tolerated treatment well   Behavior During Therapy [facility] for tasks assessed/performed      Past Medical History:  Diagnosis Date  . Gout   . HTN (hypertension)   . OSA (obstructive sleep apnea) 08/04/2014    Past Surgical History:  Procedure Laterality Date  . WISDOM TOOTH EXTRACTION  2010    There were no vitals filed for this visit.      Subjective Assessment - 03/05/17 0818    Subjective Patient reported increased pain, and using arm with normal ADL's. Patient is awaiting for an appt for MRI. Patient reported increased throbbing after electrical stimulation.   Patient Stated Goals Use right UE wihtout pain.   Currently in Pain? Yes   Pain Score 4    Pain Location Shoulder   Pain Orientation Right   Pain Descriptors / Indicators Aching;Throbbing   Pain Type Chronic pain   Pain Onset More than a month ago   Pain Frequency Constant   Aggravating Factors  any movement   Pain Relieving Factors at reat                         [facility] Adult PT Treatment/Exercise - 03/05/17 0001      Exercises   Exercises Shoulder     Shoulder Exercises: Standing   Other Standing Exercises 4 way isometrics 5 sec x10 each way     Shoulder Exercises: Pulleys   Flexion Other (comment)    Other Pulley Exercises UE ranger for elevation and circles 2x10     Modalities   Modalities Ultrasound     Ultrasound   Ultrasound  Location right shoulder acromial ridge   Ultrasound Parameters 1.5w/cm2/50%/48mhz x68min   Ultrasound Goals Pain     Manual Therapy   Manual Therapy Soft tissue mobilization   Soft tissue mobilization gentle mauanl STW to right shoulder acromial ridge area of pain                     PT Long Term Goals - 03/03/17 1018      PT LONG TERM GOAL #1   Title Independent with a HEP.   Time 6   Period Weeks   Status New     PT LONG TERM GOAL #2   Title Active shoulder flexion to 155 degrees so the patient can easily reach overhead with pain not > 2/10.   Time 6   Period Weeks   Status New     PT LONG TERM GOAL #3   Title Active ER to 80 degrees+ to allow for easily donning/doffing of apparel   Time 6   Period Weeks   Status New     PT LONG TERM GOAL #4   Title Increase shoulder strength to a solid 5/5 to increase stability for performance of functional activities   Time 6   Period Weeks  Status New     PT LONG TERM GOAL #5   Title Perform ADL's with pain not > 2/10.   Time 6   Period Weeks   Status New               Plan - 03/05/17 0901    Clinical Impression Statement Patient tolerated treatment well today. Patient has ongoing throbbing pain with movement yet is able to acheive full AAROM. Patient has most pain in right acromial ridge area. Patient did well with isometrics and AAROM. Patient did not like the electical stimulation and requested not to have it again. Patient current goals ongoing due to pain, and strength deficts.    Rehab Potential Excellent   PT Frequency 2x / week   PT Duration 6 weeks   PT Treatment/Interventions ADLs/Self Care Home Management;Cryotherapy;Electrical Stimulation;Moist Heat;Ultrasound;Therapeutic exercise;Therapeutic activities;Patient/family education;Manual techniques;Vasopneumatic Device;Dry needling;Passive range of motion   PT Next Visit Plan Combo e'stim/U/S to affected right shoulder acromial ridge and  Infraspinatus f/b STW/M.  UE Ranger and wall climbs and gentle doorway stretch to increase ER.  Start with low-level yellow theraband RW4 then progress to PRE's. NO Electrial stimulation.   Consulted and Agree with Plan of Care Patient      Patient will benefit from skilled therapeutic intervention in order to improve the following deficits and impairments:  Decreased activity tolerance, Pain, Decreased strength, Decreased range of motion  Visit Diagnosis: Chronic right shoulder pain  Stiffness of right shoulder, not elsewhere classified  Muscle weakness (generalized)     Problem List Patient Active Problem List   Diagnosis Date Noted  . Low testosterone 12/28/2015  . Tobacco abuse 12/26/2015  . HTN (hypertension) 07/11/2015  . Obesity 07/11/2015  . Fatigue 07/11/2015  . OSA (obstructive sleep apnea) 08/04/2014  . Gout 03/07/2014    Syler Norcia P, PTA 03/05/2017, 9:05 AM  [facility] [address] [place], [place], [address] Phone: [phone]   Fax:  [phone]  Name: Pollie MeyerJames Michael Snavely MRN: [id number] Date of Birth: 01-06-1981

## 2017-03-10 ENCOUNTER — Ambulatory Visit: Payer: 59 | Admitting: Physical Therapy

## 2017-03-10 ENCOUNTER — Encounter: Payer: Self-pay | Admitting: Physical Therapy

## 2017-03-10 DIAGNOSIS — G8929 Other chronic pain: Secondary | ICD-10-CM

## 2017-03-10 DIAGNOSIS — M6281 Muscle weakness (generalized): Secondary | ICD-10-CM

## 2017-03-10 DIAGNOSIS — M25611 Stiffness of right shoulder, not elsewhere classified: Secondary | ICD-10-CM

## 2017-03-10 DIAGNOSIS — M25511 Pain in right shoulder: Secondary | ICD-10-CM | POA: Diagnosis not present

## 2017-03-10 NOTE — Therapy (Addendum)
[facility] [address] [place], [place], [address] Phone: [phone]   Fax:  [phone]  Physical Therapy Treatment  Patient Details  Name: Casey MeyerJames Michael Scoggin MRN: [id number] Date of Birth: [birth year] Referring Provider: Mechele ClaudeWarren Stacks MD.  Encounter Date: 03/10/2017      PT End of Session - 03/10/17 0819    Visit Number 3   Number of Visits 12   Date for PT Re-Evaluation 04/14/17   PT Start Time 0818   PT Stop Time 0858   PT Time Calculation (min) 40 min   Activity Tolerance Patient tolerated treatment well   Behavior During Therapy [facility] for tasks assessed/performed      Past Medical History:  Diagnosis Date  . Gout   . HTN (hypertension)   . OSA (obstructive sleep apnea) 08/04/2014    Past Surgical History:  Procedure Laterality Date  . WISDOM TOOTH EXTRACTION  2010    There were no vitals filed for this visit.      Subjective Assessment - 03/10/17 0818    Subjective Reports that they still have not scheduled his MRI for his shoulder but will talk with Dr office upon leaving clinic today. Reports that at times pain can be 10/10 and sometimes it could be 3/10. Patient reports that intermittantly he has radiation of pain into his neck.   Patient Stated Goals Use right UE wihtout pain.   Currently in Pain? Yes   Pain Score --  Varies per patient report   Pain Location Shoulder   Pain Orientation Right   Pain Type Chronic pain   Pain Onset More than a month ago   Pain Frequency Intermittent            [facility] PT Assessment - 03/10/17 0001      Assessment   Medical Diagnosis Rotator cuff syndroem of right shoulder.     Precautions   Precautions None     Restrictions   Weight Bearing Restrictions No                     [facility] Adult PT Treatment/Exercise - 03/10/17 0001      Shoulder Exercises: Pulleys   Flexion Other (comment)  x5 min   Other Pulley Exercises UE ranger for elevation and  circles 2x10     Shoulder Exercises: ROM/Strengthening   UBE (Upper Arm Bike) 120 RPM x5 min     Modalities   Modalities Ultrasound     Ultrasound   Ultrasound Location R superior shoulder   Ultrasound Parameters 1.5 w/cm2, 100%, 1 mhz x10 min   Ultrasound Goals Pain     Manual Therapy   Manual Therapy Myofascial release   Myofascial Release IASTW to R superior shoulder/ deltoids/ posterior shoulder/ prox. Bicep to reduce tension and promote blood flow                     PT Long Term Goals - 03/03/17 1018      PT LONG TERM GOAL #1   Title Independent with a HEP.   Time 6   Period Weeks   Status New     PT LONG TERM GOAL #2   Title Active shoulder flexion to 155 degrees so the patient can easily reach overhead with pain not > 2/10.   Time 6   Period Weeks   Status New     PT LONG TERM GOAL #3   Title Active ER to 80 degrees+ to allow  for easily donning/doffing of apparel   Time 6   Period Weeks   Status New     PT LONG TERM GOAL #4   Title Increase shoulder strength to a solid 5/5 to increase stability for performance of functional activities   Time 6   Period Weeks   Status New     PT LONG TERM GOAL #5   Title Perform ADL's with pain not > 2/10.   Time 6   Period Weeks   Status New               Plan - 03/10/17 1610    Clinical Impression Statement Patient tolerated today's treatment fairly well as he arrived with continued increased pain which varies. Patient able to complete AAROM exercises but required pressure to superior R shoulder which relieves discomfort per patient report. Normal modalities response noted following end of [place] session. IASTW completed to R shoulder per MPT approval with large amount of increased redness throughout R shoulder but especially in lateroposterior R shoulder. Patient educated that he may experience soreness following treatment. Biofreeze applied to R shoulder at end of treatment.   Rehab Potential Excellent    PT Frequency 2x / week   PT Duration 6 weeks   PT Treatment/Interventions ADLs/Self Care Home Management;Cryotherapy;Electrical Stimulation;Moist Heat;Ultrasound;Therapeutic exercise;Therapeutic activities;Patient/family education;Manual techniques;Vasopneumatic Device;Dry needling;Passive range of motion   PT Next Visit Plan Continue with ROM exercises with caution to pain per MPT POC. NO E-STIM.   Consulted and Agree with Plan of Care Patient      Patient will benefit from skilled therapeutic intervention in order to improve the following deficits and impairments:  Decreased activity tolerance, Pain, Decreased strength, Decreased range of motion  Visit Diagnosis: Chronic right shoulder pain  Stiffness of right shoulder, not elsewhere classified  Muscle weakness (generalized)     Problem List Patient Active Problem List   Diagnosis Date Noted  . Low testosterone 12/28/2015  . Tobacco abuse 12/26/2015  . HTN (hypertension) 07/11/2015  . Obesity 07/11/2015  . Fatigue 07/11/2015  . OSA (obstructive sleep apnea) 08/04/2014  . Gout 03/07/2014    Evelene Croon, PTA 03/10/2017, 4:19 PM  [facility] [address] [place], [place], [address] Phone: [phone]   Fax:  [phone]  Name: Jacobb Alen MRN: [id number] Date of Birth: 1981-03-18

## 2017-03-12 ENCOUNTER — Encounter: Payer: Self-pay | Admitting: Physical Therapy

## 2017-03-12 ENCOUNTER — Ambulatory Visit: Payer: 59 | Admitting: Physical Therapy

## 2017-03-12 DIAGNOSIS — M6281 Muscle weakness (generalized): Secondary | ICD-10-CM

## 2017-03-12 DIAGNOSIS — M25611 Stiffness of right shoulder, not elsewhere classified: Secondary | ICD-10-CM

## 2017-03-12 DIAGNOSIS — M25511 Pain in right shoulder: Secondary | ICD-10-CM | POA: Diagnosis not present

## 2017-03-12 DIAGNOSIS — G8929 Other chronic pain: Secondary | ICD-10-CM

## 2017-03-12 NOTE — Therapy (Signed)
Casey Hoover, Casey Hoover, Casey Hoover Phone: [phone]   Fax:  [phone]  Physical Therapy Treatment  Patient Details  Name: Casey Hoover MRN: [id number] Date of Birth: September 28, 1981 Referring Provider: Mechele ClaudeWarren Stacks MD.  Encounter Date: 03/12/2017      PT End of Session - 03/12/17 0824    Visit Number 4   Number of Visits 12   Date for PT Re-Evaluation 04/14/17   PT Start Time 0822   PT Stop Time 0901   PT Time Calculation (min) 39 min   Activity Tolerance Patient tolerated treatment well   Behavior [facility] for tasks assessed/performed      Past Medical History:  Diagnosis Date  . Gout   . HTN (hypertension)   . OSA (obstructive sleep apnea) 08/04/2014    Past Surgical History:  Procedure Laterality Date  . WISDOM TOOTH EXTRACTION  2010    There were no vitals filed for this visit.      Subjective Assessment - 03/12/17 0823    Subjective Reports that MD office said he should be hearing some time this week regarding MRI. Reports that his shoulder feels the same and that he was sore following IASTW last treatment. Reports that following previous IASTW session that afternoon he felt like his shoulder felt some better.   Patient Stated Goals Use right UE wihtout pain.   Currently in Pain? Yes   Pain Score --  Varies but the "same" per patient report   Pain Location Shoulder   Pain Orientation Right   Pain Descriptors / Indicators Aching;Throbbing   Pain Type Chronic pain   Pain Onset More than a month ago            [facility] - 03/12/17 0001      Assessment   Medical Diagnosis Rotator cuff syndroem of right shoulder.     Precautions   Precautions None     Restrictions   Weight Bearing Restrictions No                     [facility] Adult PT Treatment/Exercise - 03/12/17 0001      Shoulder Exercises: Pulleys   Other Pulley Exercises UE ranger for  elevation and circles 2x10  Patient provided overpressure to posterior R shoulder   Other Pulley Exercises RUE wall slides with ER x15 reps     Shoulder Exercises: ROM/Strengthening   UBE (Upper Arm Bike) 120 RPM x5 min     Modalities   Modalities Ultrasound     Ultrasound   Ultrasound Location R superior shoulder   Ultrasound Parameters 1.5 w/cm2 ,100%, 1 mhz x10 min   Ultrasound Goals Pain     Manual Therapy   Manual Therapy Myofascial release   Myofascial Release IASTW to R superior shoulder/ deltoids/ posterior shoulder/ prox. Bicep to reduce tension and promote blood flow                     PT Long Term Goals - 03/03/17 1018      PT LONG TERM GOAL #1   Title Independent with a HEP.   Time 6   Period Weeks   Status New     PT LONG TERM GOAL #2   Title Active shoulder flexion to 155 degrees so the patient can easily reach overhead with pain not > 2/10.   Time 6   Period Weeks   Status New  PT LONG TERM GOAL #3   Title Active ER to 80 degrees+ to allow for easily donning/doffing of apparel   Time 6   Period Weeks   Status New     PT LONG TERM GOAL #4   Title Increase shoulder strength to a solid 5/5 to increase stability for performance of functional activities   Time 6   Period Weeks   Status New     PT LONG TERM GOAL #5   Title Perform ADL's with pain not > 2/10.   Time 6   Period Weeks   Status New               Plan - 03/12/17 1610    Clinical Impression Statement Patient tolerated today's treatment fairly well although he states that his shoulder feels about the same. Patient did not verablize any complaints during treatment but facial grimacing noted with UE ranger and wall slides. Normal [place] response noted following end of the session with increased redness noted during the session. IASTW completed again to entire superior R shoulder with increased redness presenting throughout treated area. Tightness palpated in R mid and  posterior deltoids with soreness primarily in posterior R shoulder. Overpressure required by patient especially with flexion exercises.      Rehab Potential Excellent   PT Frequency 2x / week   PT Duration 6 weeks   PT Treatment/Interventions ADLs/Self Care Home Management;Cryotherapy;Electrical Stimulation;Moist Heat;Ultrasound;Therapeutic exercise;Therapeutic activities;Patient/family education;Manual techniques;Vasopneumatic Device;Dry needling;Passive range of motion   PT Next Visit Plan Continue with ROM exercises with caution to pain per MPT POC. NO E-STIM.   Consulted and Agree with Plan of Care Patient      Patient will benefit from skilled therapeutic intervention in order to improve the following deficits and impairments:  Decreased activity tolerance, Pain, Decreased strength, Decreased range of motion  Visit Diagnosis: Chronic right shoulder pain  Stiffness of right shoulder, not elsewhere classified  Muscle weakness (generalized)     Problem List Patient Active Problem List   Diagnosis Date Noted  . Low testosterone 12/28/2015  . Tobacco abuse 12/26/2015  . HTN (hypertension) 07/11/2015  . Obesity 07/11/2015  . Fatigue 07/11/2015  . OSA (obstructive sleep apnea) 08/04/2014  . Gout 03/07/2014    Evelene Croon, PTA 03/12/2017, 9:28 AM  [facility] [address] [place], [place], [address] Phone: [phone]   Fax:  [phone]  Name: Casey Hoover MRN: [id number] Date of Birth: 31-Aug-1981

## 2017-03-13 NOTE — Telephone Encounter (Signed)
Attempted to contact patient - NA. This encounter will be closed 

## 2017-03-18 ENCOUNTER — Ambulatory Visit: Payer: 59 | Admitting: *Deleted

## 2017-03-18 DIAGNOSIS — M6281 Muscle weakness (generalized): Secondary | ICD-10-CM

## 2017-03-18 DIAGNOSIS — M25511 Pain in right shoulder: Principal | ICD-10-CM

## 2017-03-18 DIAGNOSIS — G8929 Other chronic pain: Secondary | ICD-10-CM

## 2017-03-18 DIAGNOSIS — M25611 Stiffness of right shoulder, not elsewhere classified: Secondary | ICD-10-CM

## 2017-03-18 NOTE — Therapy (Signed)
[facility] [address] [place], [place], [address] Phone: [phone]   Fax:  [phone]  Physical Therapy Treatment  Patient Details  Name: Kelley Knoth MRN: [id number] Date of Birth: 02-24-1981 Referring Provider: Mechele Claude MD.  Encounter Date: 03/18/2017      PT End of Session - 03/18/17 0838    Visit Number 5   Number of Visits 12   Date for PT Re-Evaluation 04/14/17   PT Start Time 0815   PT Stop Time 0904   PT Time Calculation (min) 49 min   Activity Tolerance Patient tolerated treatment well   Behavior During Therapy [facility] for tasks assessed/performed      Past Medical History:  Diagnosis Date  . Gout   . HTN (hypertension)   . OSA (obstructive sleep apnea) 08/04/2014    Past Surgical History:  Procedure Laterality Date  . WISDOM TOOTH EXTRACTION  2010    There were no vitals filed for this visit.      Subjective Assessment - 03/18/17 0931    Subjective Pain is about the same 3-8/ 10 depending what I'm doing   Patient Stated Goals Use right UE wihtout pain.   Currently in Pain? Yes   Pain Score 6    Pain Location Shoulder   Pain Orientation Right   Pain Descriptors / Indicators Aching;Sore;Throbbing   Pain Type Chronic pain   Pain Onset More than a month ago   Pain Frequency Intermittent                         [facility] Adult PT Treatment/Exercise - 03/18/17 0001      Exercises   Exercises Shoulder     Shoulder Exercises: Standing   External Rotation Strengthening;Right;Theraband  3x10 yellow   Internal Rotation Strengthening;Right;Theraband  3x15-20  red     Shoulder Exercises: ROM/Strengthening   UBE (Upper Arm Bike) --     Modalities   Modalities Ultrasound;Vasopneumatic     Ultrasound   Ultrasound Location RT shldrposteriolateral aspect   Ultrasound Parameters 1.5 w/cm2 x 12 mins   Ultrasound Goals Pain     Vasopneumatic   Number Minutes Vasopneumatic  15  minutes   Vasopnuematic Location  Shoulder   Vasopneumatic Pressure Low   Vasopneumatic Temperature  38     Manual Therapy   Manual Therapy Myofascial release   Myofascial Release IASTW to R superior shoulder/ deltoids/ posterior shoulder/ prox. Bicep to reduce tension and promote blood flow                     PT Long Term Goals - 03/03/17 1018      PT LONG TERM GOAL #1   Title Independent with a HEP.   Time 6   Period Weeks   Status New     PT LONG TERM GOAL #2   Title Active shoulder flexion to 155 degrees so the patient can easily reach overhead with pain not > 2/10.   Time 6   Period Weeks   Status New     PT LONG TERM GOAL #3   Title Active ER to 80 degrees+ to allow for easily donning/doffing of apparel   Time 6   Period Weeks   Status New     PT LONG TERM GOAL #4   Title Increase shoulder strength to a solid 5/5 to increase stability for performance of functional activities   Time 6   Period Weeks  Status New     PT LONG TERM GOAL #5   Title Perform ADL's with pain not > 2/10.   Time 6   Period Weeks   Status New               Plan - 03/18/17 0934    Clinical Impression Statement Pt arrived to clinic today doing about the same. He feels short term relief with PT. His RT shldr pain still ranges from 3-9/ 10 depending on what he is doing. He feels that he needs further testing and MRI has not been scheduled yet.  He did fair with IR/ and ER, but had pain with ER.   Rehab Potential Excellent   PT Frequency 2x / week   PT Duration 6 weeks   PT Treatment/Interventions ADLs/Self Care Home Management;Cryotherapy;Electrical Stimulation;Moist Heat;Ultrasound;Therapeutic exercise;Therapeutic activities;Patient/family education;Manual techniques;Vasopneumatic Device;Dry needling;Passive range of motion   PT Next Visit Plan Continue with ROM exercises with caution to pain per MPT POC. NO E-STIM.      Consulted and Agree with Plan of Care Patient       Patient will benefit from skilled therapeutic intervention in order to improve the following deficits and impairments:  Decreased activity tolerance, Pain, Decreased strength, Decreased range of motion  Visit Diagnosis: Chronic right shoulder pain  Stiffness of right shoulder, not elsewhere classified  Muscle weakness (generalized)     Problem List Patient Active Problem List   Diagnosis Date Noted  . Low testosterone 12/28/2015  . Tobacco abuse 12/26/2015  . HTN (hypertension) 07/11/2015  . Obesity 07/11/2015  . Fatigue 07/11/2015  . OSA (obstructive sleep apnea) 08/04/2014  . Gout 03/07/2014    Vashon Arch,CHRIS, PTA 03/18/2017, 9:45 AM  [facility] [address] [place], [place], [address] Phone: [phone]   Fax:  [phone]  Name: Pollie MeyerJames Michael Albritton MRN: [id number] Date of Birth: [birth year]

## 2017-03-19 ENCOUNTER — Ambulatory Visit: Payer: 59 | Admitting: Physical Therapy

## 2017-03-19 ENCOUNTER — Encounter: Payer: Self-pay | Admitting: Physical Therapy

## 2017-03-19 DIAGNOSIS — M25511 Pain in right shoulder: Principal | ICD-10-CM

## 2017-03-19 DIAGNOSIS — M25611 Stiffness of right shoulder, not elsewhere classified: Secondary | ICD-10-CM

## 2017-03-19 DIAGNOSIS — G8929 Other chronic pain: Secondary | ICD-10-CM

## 2017-03-19 DIAGNOSIS — M6281 Muscle weakness (generalized): Secondary | ICD-10-CM

## 2017-03-19 NOTE — Therapy (Signed)
[facility] [address] [place], [place], [address] Phone: [phone]   Fax:  [phone]  Physical Therapy Treatment  Patient Details  Name: Casey Hoover MRN: [id number] Date of Birth: 10/31/1980 Referring Provider: Mechele ClaudeWarren Stacks MD.  Encounter Date: 03/19/2017      PT End of Session - 03/19/17 0821    Visit Number 6   Number of Visits 12   Date for PT Re-Evaluation 04/14/17   PT Start Time 0817   PT Stop Time 0900   PT Time Calculation (min) 43 min   Activity Tolerance Patient tolerated treatment well   Behavior During Therapy [facility] for tasks assessed/performed      Past Medical History:  Diagnosis Date  . Gout   . HTN (hypertension)   . OSA (obstructive sleep apnea) 08/04/2014    Past Surgical History:  Procedure Laterality Date  . WISDOM TOOTH EXTRACTION  2010    There were no vitals filed for this visit.      Subjective Assessment - 03/19/17 0819    Subjective Reports that he still has not heard regarding scheduling of MRI. Reports that his shoulder feels the same as it has.   Patient Stated Goals Use right UE wihtout pain.   Currently in Pain? Yes   Pain Score --  "nothing's changed"   Pain Location Shoulder   Pain Orientation Right   Pain Type Chronic pain   Pain Onset More than a month ago            [facility] Assessment - 03/19/17 0001      Assessment   Medical Diagnosis Rotator cuff syndroem of right shoulder.     Precautions   Precautions None     Restrictions   Weight Bearing Restrictions No                     [facility] Adult PT Treatment/Exercise - 03/19/17 0001      Shoulder Exercises: Standing   Internal Rotation Strengthening;Right;Theraband   Theraband Level (Shoulder Internal Rotation) Level 2 (Red)   Internal Rotation Limitations 3x10 reps   Other Standing Exercises RUE wall slides with ER x20 reps     Shoulder Exercises: ROM/Strengthening   UBE (Upper Arm  Bike) 120 RPM x5 min     Modalities   Modalities Ultrasound     Ultrasound   Ultrasound Location R posteriolateral shoulder   Ultrasound Parameters 1.5 w/cm2, 100%, 1 mhz x10 min   Ultrasound Goals Pain     Manual Therapy   Manual Therapy Myofascial release   Myofascial Release IASTW to R superior shoulder/ deltoids/ posterior shoulder to reduce tension and promote blood flow                     PT Long Term Goals - 03/03/17 1018      PT LONG TERM GOAL #1   Title Independent with a HEP.   Time 6   Period Weeks   Status New     PT LONG TERM GOAL #2   Title Active shoulder flexion to 155 degrees so the patient can easily reach overhead with pain not > 2/10.   Time 6   Period Weeks   Status New     PT LONG TERM GOAL #3   Title Active ER to 80 degrees+ to allow for easily donning/doffing of apparel   Time 6   Period Weeks   Status New     PT  LONG TERM GOAL #4   Title Increase shoulder strength to a solid 5/5 to increase stability for performance of functional activities   Time 6   Period Weeks   Status New     PT LONG TERM GOAL #5   Title Perform ADL's with pain not > 2/10.   Time 6   Period Weeks   Status New               Plan - 03/19/17 0912    Clinical Impression Statement Patient arrived to treatment with continued R shoulder pain and tone palpated along R posteriolateral shoulder. Patient completes UBE with slow pace with light resistance. Experienced increased pain with R shoulder ER yesterday per patient report. Normal [place] response noted to posteriolateral R shoulder today. IASTW completed to R posteriolateral shoulder to reduce tone and increase blood flow for healing with no complaints from patient.   Rehab Potential Excellent   PT Frequency 2x / week   PT Duration 6 weeks   PT Treatment/Interventions ADLs/Self Care Home Management;Cryotherapy;Electrical Stimulation;Moist Heat;Ultrasound;Therapeutic exercise;Therapeutic  activities;Patient/family education;Manual techniques;Vasopneumatic Device;Dry needling;Passive range of motion   PT Next Visit Plan Continue with ROM exercises with caution to pain per MPT POC. NO E-STIM.      Consulted and Agree with Plan of Care Patient      Patient will benefit from skilled therapeutic intervention in order to improve the following deficits and impairments:  Decreased activity tolerance, Pain, Decreased strength, Decreased range of motion  Visit Diagnosis: Chronic right shoulder pain  Stiffness of right shoulder, not elsewhere classified  Muscle weakness (generalized)     Problem List Patient Active Problem List   Diagnosis Date Noted  . Low testosterone 12/28/2015  . Tobacco abuse 12/26/2015  . HTN (hypertension) 07/11/2015  . Obesity 07/11/2015  . Fatigue 07/11/2015  . OSA (obstructive sleep apnea) 08/04/2014  . Gout 03/07/2014    Evelene Croon, PTA 03/19/2017, 9:15 AM  [facility] [address] [place], [place], [address] Phone: [phone]   Fax:  [phone]  Name: Casey Hoover MRN: [id number] Date of Birth: 1981/08/20

## 2017-03-24 ENCOUNTER — Encounter: Payer: 59 | Admitting: Physical Therapy

## 2017-03-25 ENCOUNTER — Telehealth: Payer: Self-pay | Admitting: Family Medicine

## 2017-03-25 NOTE — Telephone Encounter (Signed)
Pt advised the MRI was denied because he needs to do 6 weeks of medication or PT. Pt scheduled appt for tomorrow with Dr Darlyn ReadStacks at 9:25 to discuss medications.

## 2017-03-26 ENCOUNTER — Ambulatory Visit: Payer: 59 | Attending: Family Medicine | Admitting: Physical Therapy

## 2017-03-26 ENCOUNTER — Ambulatory Visit (INDEPENDENT_AMBULATORY_CARE_PROVIDER_SITE_OTHER): Payer: 59 | Admitting: Family Medicine

## 2017-03-26 ENCOUNTER — Encounter: Payer: Self-pay | Admitting: Family Medicine

## 2017-03-26 ENCOUNTER — Encounter: Payer: Self-pay | Admitting: Physical Therapy

## 2017-03-26 VITALS — BP 136/81 | HR 91 | Temp 97.2°F | Ht 73.0 in | Wt 378.0 lb

## 2017-03-26 DIAGNOSIS — M25611 Stiffness of right shoulder, not elsewhere classified: Secondary | ICD-10-CM | POA: Diagnosis present

## 2017-03-26 DIAGNOSIS — M25511 Pain in right shoulder: Secondary | ICD-10-CM | POA: Insufficient documentation

## 2017-03-26 DIAGNOSIS — G8929 Other chronic pain: Secondary | ICD-10-CM | POA: Insufficient documentation

## 2017-03-26 DIAGNOSIS — M7541 Impingement syndrome of right shoulder: Secondary | ICD-10-CM | POA: Diagnosis not present

## 2017-03-26 DIAGNOSIS — M6281 Muscle weakness (generalized): Secondary | ICD-10-CM | POA: Insufficient documentation

## 2017-03-26 MED ORDER — TRAMADOL HCL 50 MG PO TABS: ORAL_TABLET | ORAL | 0 refills | Status: DC

## 2017-03-26 NOTE — Patient Instructions (Signed)
  Strengthening: Resisted Flexion   Hold tubing with right arm at side. Pull forward and up. Move shoulder through pain-free range of motion. Repeat __10__ times per set. Do _2-3___ sets per session. Do _2-3___ sessions per day. http://orth.exer.us/824   Copyright  VHI. All rights reserved.  Strengthening: Resisted Extension   Hold tubing in right hand, arm forward. Pull arm back, elbow straight. Repeat __10__ times per set. Do _2-3___ sets per session. Do _2-3___ sessions per day.  http://orth.exer.us/832   Copyright  VHI. All rights reserved.  Strengthening: Resisted Internal Rotation   Hold tubing in RIGHT hand, elbow at side and forearm out. Rotate forearm in across body. Repeat __10__ times per set. Do _2-3___ sets per session. Do _2-3___ sessions per day.  http://orth.exer.us/830   Copyright  VHI. All rights reserved.  Strengthening: Resisted External Rotation   Hold tubing in right hand, elbow at side and forearm across body. Rotate forearm out. Repeat __10__ times per set. Do __2-3__ sets per session. Do ____ sessions per day.  http://orth.exer.us/828   Copyright  VHI. All rights reserved.

## 2017-03-26 NOTE — Therapy (Signed)
[facility] [place], [place], [address] Phone: [phone]   Fax:  [phone]  Physical Therapy Treatment  Patient Details  Name: Casey Hoover MRN: [id number] Date of Birth: 09/10/1981 Referring Provider: Claretta Fraise MD.  Encounter Date: 03/26/2017      PT End of Session - 03/26/17 0847    Visit Number 7   Number of Visits 12   Date for PT Re-Evaluation 04/14/17   PT Start Time 0817   PT Stop Time 0858   PT Time Calculation (min) 41 min   Activity Tolerance Patient tolerated treatment well;Patient limited by pain   Behavior During Therapy [facility] for tasks assessed/performed      Past Medical History:  Diagnosis Date  . Gout   . HTN (hypertension)   . OSA (obstructive sleep apnea) 08/04/2014    Past Surgical History:  Procedure Laterality Date  . WISDOM TOOTH EXTRACTION  2010    There were no vitals filed for this visit.      Subjective Assessment - 03/26/17 0818    Subjective Patient reported that he is still in a lot of pain and MRI denied and needs to complete therapy first   Patient Stated Goals Use right UE wihtout pain.   Currently in Pain? Yes   Pain Score 5    Pain Location Shoulder   Pain Orientation Right   Pain Descriptors / Indicators Aching;Sore;Throbbing   Pain Type Chronic pain   Pain Onset More than a month ago   Pain Frequency Constant   Aggravating Factors  any movement   Pain Relieving Factors at rest                         [facility] Adult PT Treatment/Exercise - 03/26/17 0001      Shoulder Exercises: Sidelying   External Rotation Strengthening;Right;Weights   External Rotation Weight (lbs) 2   Internal Rotation Strengthening;Right;Weights   Internal Rotation Weight (lbs) 2     Shoulder Exercises: Standing   Protraction Strengthening;Right;Theraband  x30   Theraband Level (Shoulder Protraction) Level 2 (Red)   External Rotation  Strengthening;Right;Theraband  3x10   Theraband Level (Shoulder External Rotation) Level 2 (Red)  difficulty /yellow band next tx   Internal Rotation Strengthening;Right;Theraband  x30   Theraband Level (Shoulder Internal Rotation) Level 2 (Red)   Row Strengthening;Right;Theraband  x30   Theraband Level (Shoulder Row) Level 2 (Red)     Shoulder Exercises: ROM/Strengthening   UBE (Upper Arm Bike) 90 RPM x8 min     Ultrasound   Ultrasound Location right supirior   Ultrasound Parameters 1.5w/cm2/50%/61mz x157m   Ultrasound Goals Pain                PT Education - 03/26/17 0857    Education provided Yes   Education Details HEP for shoulder 4 way band (red/yellow t-band)   Person(s) Educated Patient   Methods Explanation;Demonstration;Handout   Comprehension Verbalized understanding;Returned demonstration             PT Long Term Goals - 03/26/17 0830      PT LONG TERM GOAL #1   Title Independent with a HEP.   Time 6   Period Weeks   Status On-going     PT LONG TERM GOAL #2   Title Active shoulder flexion to 155 degrees so the patient can easily reach overhead with pain not > 2/10.   Time 6   Period Weeks  Status Achieved  AROM 160 degrees 03/26/17     PT LONG TERM GOAL #3   Title Active ER to 80 degrees+ to allow for easily donning/doffing of apparel   Time 6   Period Weeks   Status On-going  AROM 80 degrees 03/26/17     PT LONG TERM GOAL #4   Title Increase shoulder strength to a solid 5/5 to increase stability for performance of functional activities   Time 6   Period Weeks   Status New     PT LONG TERM GOAL #5   Title Perform ADL's with pain not > 2/10.   Time 6   Period Weeks   Status On-going               Plan - 03/26/17 4818    Clinical Impression Statement Patient tolerated treatment fairly well yet limited by pain. Patient able to gently strengthen shoulder with some difficulty with weakness and pain. Patient given HEP for  gentle strengtheing. Patient has full AROM yet painful movements. Patient met ROM goals foe flexion/ ER and others ongoing due to pain and strength deficts.    Rehab Potential Excellent   PT Frequency 2x / week   PT Duration 6 weeks   PT Treatment/Interventions ADLs/Self Care Home Management;Cryotherapy;Electrical Stimulation;Moist Heat;Ultrasound;Therapeutic exercise;Therapeutic activities;Patient/family education;Manual techniques;Vasopneumatic Device;Dry needling;Passive range of motion   PT Next Visit Plan Continue with ROM exercises with caution to pain per MPT POC    Consulted and Agree with Plan of Care Patient      Patient will benefit from skilled therapeutic intervention in order to improve the following deficits and impairments:  Decreased activity tolerance, Pain, Decreased strength, Decreased range of motion  Visit Diagnosis: Chronic right shoulder pain  Stiffness of right shoulder, not elsewhere classified  Muscle weakness (generalized)     Problem List Patient Active Problem List   Diagnosis Date Noted  . Low testosterone 12/28/2015  . Tobacco abuse 12/26/2015  . HTN (hypertension) 07/11/2015  . Obesity 07/11/2015  . Fatigue 07/11/2015  . OSA (obstructive sleep apnea) 08/04/2014  . Gout 03/07/2014    Marlys Stegmaier P, PTA 03/26/2017, 9:05 AM  [facility] [place], [place], [address] Phone: [phone]   Fax:  [phone]  Name: Zymier Rodgers MRN: [id number] Date of Birth: 1981-01-03

## 2017-03-26 NOTE — Progress Notes (Signed)
Chief Complaint  Patient presents with  . Shoulder Pain    pt here today c/o right shoulder pain, hasn't gotten any better with PT for almost a month now.    HPI  Patient presents today for Recheck of rotator cuff impingement. Last seen 1 month ago. His progress has been essentially 0. He was denied MRI because they wanted 6 weeks of physical therapy. Unfortunately's been going regularly completed 7 treatments and has not gotten any improvement even with therapeutic exercise iontophoresis etc. Physical therapy summary reviewed. Pain is intermittently 8/10. Sometimes it is tolerable. Not getting relief from any NSAID. Pain is interfering with life activities  PMH: Smoking status noted ROS: Per HPI  Objective: BP 136/81   Pulse 91   Temp 97.2 F (36.2 C) (Oral)   Ht 6\' 1"  (1.854 m)   Wt (!) 378 lb (171.5 kg)   BMI 49.87 kg/m  Gen: NAD, alert, cooperative with exam HEENT: NCAT, EOMI, PERRL Resp: CTABL, no wheezes, non-labored Ext: No edema, warm. Marked tenderness over the deltoid for palpation laterally and anteriorly. Strength 3/5 for abduction of the right  shoulder. 3-4/5 for external rotation. Right upper extremity pulses intact. Neuro: Alert and oriented, No gross deficits. The right upper extremity is intact for light touch throughout.  Assessment and plan:  1. Rotator cuff impingement syndrome of right shoulder     Meds ordered this encounter  Medications  . traMADol (ULTRAM) 50 MG tablet    Sig: 1 or 2 tablets 3 times daily as needed for moderate pain    Dispense:  60 tablet    Refill:  0    Orders Placed This Encounter  Procedures  . Ambulatory referral to Orthopedics    Referral Priority:   Routine    Referral Type:   Consultation    Number of Visits Requested:   1  This case has strong likelihood of requiring orthopedic surgery for rotator cuff tear. MRI will be required for that. Since it was denied due to needing physical therapy patient should continue his  therapy and perhaps the orthopedist can go ahead and get the MRI approved and expedite this patient's  recuperation  Follow up as needed.Due to orthopedic referral. He should follow up with physical therapy to finish out his 6 week course.  Mechele ClaudeWarren Annett Boxwell, MD

## 2017-03-30 ENCOUNTER — Other Ambulatory Visit: Payer: Self-pay | Admitting: Family Medicine

## 2017-04-02 ENCOUNTER — Encounter: Payer: Self-pay | Admitting: Physical Therapy

## 2017-04-02 ENCOUNTER — Ambulatory Visit: Payer: 59 | Admitting: Physical Therapy

## 2017-04-02 DIAGNOSIS — M25511 Pain in right shoulder: Principal | ICD-10-CM

## 2017-04-02 DIAGNOSIS — G8929 Other chronic pain: Secondary | ICD-10-CM

## 2017-04-02 DIAGNOSIS — M6281 Muscle weakness (generalized): Secondary | ICD-10-CM

## 2017-04-02 DIAGNOSIS — M25611 Stiffness of right shoulder, not elsewhere classified: Secondary | ICD-10-CM

## 2017-04-02 NOTE — Therapy (Addendum)
[facility] [place], [place], [address] Phone: [phone]   Fax:  [phone]  Physical Therapy Treatment  Patient Details  Name: Casey Hoover MRN: [id number] Date of Birth: 26-Jul-1981 Referring Provider: Claretta Fraise MD.  Encounter Date: 04/02/2017      PT End of Session - 04/02/17 0745    Visit Number 8   Number of Visits 12   Date for PT Re-Evaluation 04/14/17   PT Start Time 0742   PT Stop Time 0822   PT Time Calculation (min) 40 min   Activity Tolerance Patient tolerated treatment well;Patient limited by pain   Behavior During Therapy [facility] for tasks assessed/performed      Past Medical History:  Diagnosis Date  . Gout   . HTN (hypertension)   . OSA (obstructive sleep apnea) 08/04/2014    Past Surgical History:  Procedure Laterality Date  . WISDOM TOOTH EXTRACTION  2010    There were no vitals filed for this visit.      Subjective Assessment - 04/02/17 0744    Subjective Patient reported no change in shoulder, and has ortho appt next thurday   Patient Stated Goals Use right UE wihtout pain.   Currently in Pain? Yes   Pain Score 5    Pain Location Shoulder   Pain Orientation Right   Pain Descriptors / Indicators Aching;Sore;Throbbing   Pain Type Chronic pain   Pain Onset More than a month ago   Pain Frequency Constant   Aggravating Factors  any movement   Pain Relieving Factors at rest            [facility] PT Assessment - 04/02/17 0001      ROM / Strength   AROM / PROM / Strength AROM     AROM   AROM Assessment Site Shoulder   Right/Left Shoulder Right   Right Shoulder Flexion 160 Degrees   Right Shoulder External Rotation 80 Degrees                     [facility] Adult PT Treatment/Exercise - 04/02/17 0001      Shoulder Exercises: Supine   Protraction Strengthening;Right;20 reps;Weights   Protraction Weight (lbs) 2     Shoulder Exercises: Sidelying   External  Rotation Strengthening;Right;Weights   External Rotation Weight (lbs) 2     Shoulder Exercises: Standing   Protraction Strengthening;Right;Theraband  x30   Theraband Level (Shoulder Protraction) Level 2 (Red)   External Rotation Strengthening;Right;Theraband  x30   Theraband Level (Shoulder External Rotation) Level 2 (Red)   Internal Rotation Strengthening;Right;Theraband  x30   Theraband Level (Shoulder Internal Rotation) Level 2 (Red)   Row Strengthening;Right;Theraband  x30   Theraband Level (Shoulder Row) Level 2 (Red)     Shoulder Exercises: ROM/Strengthening   UBE (Upper Arm Bike) 90 RPM x8 min     Ultrasound   Ultrasound Location right supirior shoulder   Ultrasound Parameters 1.5w/cm2/50%/96mz x175m   Ultrasound Goals Pain                     PT Long Term Goals - 04/02/17 0746      PT LONG TERM GOAL #1   Title Independent with a HEP.   Time 6   Period Weeks   Status Achieved     PT LONG TERM GOAL #2   Title Active shoulder flexion to 155 degrees so the patient can easily reach overhead with pain not > 2/10.  Time 6   Period Weeks   Status Achieved     PT LONG TERM GOAL #3   Title Active ER to 80 degrees+ to allow for easily donning/doffing of apparel   Time 6   Period Weeks   Status Achieved     PT LONG TERM GOAL #4   Title Increase shoulder strength to a solid 5/5 to increase stability for performance of functional activities   Time 6   Period Weeks   Status On-going     PT LONG TERM GOAL #5   Title Perform ADL's with pain not > 2/10.   Time 6   Period Weeks   Status On-going               Plan - 04/02/17 0747    Clinical Impression Statement Patient tolerated treatment fairly well with some pain limitations. Patient able to complete strengthening exercises. Patient has full ROM yet painful movement reported. Patient has appt with ortho MD next week to possibly get MRI and disscuss further POC for his shoulder. Patient has  ongoing strength and pain deficts.    Rehab Potential Excellent   PT Frequency 2x / week   PT Duration 6 weeks   PT Treatment/Interventions ADLs/Self Care Home Management;Cryotherapy;Electrical Stimulation;Moist Heat;Ultrasound;Therapeutic exercise;Therapeutic activities;Patient/family education;Manual techniques;Vasopneumatic Device;Dry needling;Passive range of motion   PT Next Visit Plan Continue with ROM exercises with caution to pain per MPT POC    Consulted and Agree with Plan of Care Patient      Patient will benefit from skilled therapeutic intervention in order to improve the following deficits and impairments:  Decreased activity tolerance, Pain, Decreased strength, Decreased range of motion  Visit Diagnosis: Chronic right shoulder pain  Stiffness of right shoulder, not elsewhere classified  Muscle weakness (generalized)     Problem List Patient Active Problem List   Diagnosis Date Noted  . Low testosterone 12/28/2015  . Tobacco abuse 12/26/2015  . HTN (hypertension) 07/11/2015  . Obesity 07/11/2015  . Fatigue 07/11/2015  . OSA (obstructive sleep apnea) 08/04/2014  . Gout 03/07/2014    Kambryn Dapolito P, PTA 04/02/2017, 8:30 AM  [facility] [place], [place], [address] Phone: [phone]   Fax:  [phone]  Name: Casey Hoover MRN: [id number] Date of Birth: 09-Dec-1980  PHYSICAL THERAPY DISCHARGE SUMMARY  Visits from Start of Care: 8.  Current functional level related to goals / functional outcomes: See above.   Remaining deficits: Goals #4 and 5 unmet.   Education / Equipment: HEP. Plan: Patient agrees to discharge.  Patient goals were partially met. Patient is being discharged due to being pleased with the current functional level.  ?????         Mali Applegate MPT

## 2017-04-09 ENCOUNTER — Encounter: Payer: 59 | Admitting: Physical Therapy

## 2017-04-18 ENCOUNTER — Encounter: Payer: Self-pay | Admitting: Family Medicine

## 2017-04-18 ENCOUNTER — Ambulatory Visit (INDEPENDENT_AMBULATORY_CARE_PROVIDER_SITE_OTHER): Payer: 59 | Admitting: Family Medicine

## 2017-04-18 VITALS — BP 133/85 | HR 86 | Temp 97.4°F | Ht 73.0 in | Wt 371.0 lb

## 2017-04-18 DIAGNOSIS — M7541 Impingement syndrome of right shoulder: Secondary | ICD-10-CM

## 2017-04-18 NOTE — Progress Notes (Signed)
Subjective:  Patient ID: Casey Hoover, male    DOB: December 18, 1980  Age: 36 y.o. MRN: [id number]  CC: Shoulder Pain (pt here today following up for right shoulder pain)   HPI Casey Hoover presents for Shoulder is 70% better now. Pain is mild. He still has some weakness but that is getting better as well. He finished physical therapy and is now using therapy bands for resistance exercises for the shoulder. The orthopedist did a steroid injection and since he is getting better did not order an MRI. We asked the patient to follow-up with me and should pain return and we will consider further treatment at a later date. Patient notes that he has to climb up and down ladders and do a lot of heavy strength work in his position.  Depression screen [facility] 2/9 04/18/2017 03/26/2017 02/25/2017  Decreased Interest 1 1 1   Down, Depressed, Hopeless 1 1 1   PHQ - 2 Score 2 2 2   Altered sleeping 1 1 1   Tired, decreased energy 2 2 2   Change in appetite 1 1 1   Feeling bad or failure about yourself  0 0 0  Trouble concentrating 1 1 1   Moving slowly or fidgety/restless 0 0 0  Suicidal thoughts 0 0 0  PHQ-9 Score 7 7 7     History Casey Hoover has a past medical history of Gout; HTN (hypertension); and OSA (obstructive sleep apnea) (08/04/2014).   He has a past surgical history that includes Wisdom tooth extraction (2010).   His family history includes Diabetes in his mother and other; Heart Problems in his other; Heart attack in his father; Hypertension in his father and other.He reports that he has been smoking Cigarettes.  He has a 15.00 pack-year smoking history. He has never used smokeless tobacco. He reports that he drinks alcohol. He reports that he does not use drugs.    ROS Review of Systems  Negative except as per history of present illness  Objective:  BP 133/85   Pulse 86   Temp 97.4 F (36.3 C) (Oral)   Ht 6\' 1"  (1.854 m)   Wt (!) 371 lb (168.3 kg)   BMI 48.95 kg/m   BP Readings from  Last 3 Encounters:  04/18/17 133/85  03/26/17 136/81  02/25/17 135/80    Wt Readings from Last 3 Encounters:  04/18/17 (!) 371 lb (168.3 kg)  03/26/17 (!) 378 lb (171.5 kg)  02/25/17 (!) 368 lb (166.9 kg)     Physical Exam  Musculoskeletal: Normal range of motion. He exhibits no edema, tenderness or deformity.      Assessment & Plan:   Casey Hoover was seen today for shoulder pain.  Diagnoses and all orders for this visit:  Rotator cuff impingement syndrome of right shoulder       I have discontinued Casey Hoover's traMADol and predniSONE. I am also having him maintain his ranitidine, ibuprofen, allopurinol, COLCRYS, nitroGLYCERIN, and losartan.  Allergies as of 04/18/2017      Reactions   Sulfa Antibiotics Rash      Medication List       Accurate as of 04/18/17  5:13 PM. Always use your most recent med list.          allopurinol 100 MG tablet Commonly known as:  ZYLOPRIM Take 100 mg by mouth daily.   COLCRYS 0.6 MG tablet Generic drug:  colchicine Take 0.6 mg by mouth daily. Gout flare   ibuprofen 200 MG tablet Commonly known as:  ADVIL,MOTRIN Take 400  mg by mouth every 6 (six) hours as needed for headache.   losartan 100 MG tablet Commonly known as:  COZAAR Take 1 tablet (100 mg total) by mouth daily.   nitroGLYCERIN 0.4 MG SL tablet Commonly known as:  NITROSTAT Place 1 tablet (0.4 mg total) under the tongue every 5 (five) minutes as needed for chest pain.   ranitidine 150 MG tablet Commonly known as:  ZANTAC Take 150 mg by mouth daily as needed for heartburn.        Follow-up: Return in about 6 weeks (around 05/30/2017).  Mechele ClaudeWarren Tsuyako Jolley, M.D.

## 2017-06-13 ENCOUNTER — Ambulatory Visit (INDEPENDENT_AMBULATORY_CARE_PROVIDER_SITE_OTHER): Payer: 59 | Admitting: Physician Assistant

## 2017-06-13 ENCOUNTER — Encounter: Payer: Self-pay | Admitting: Physician Assistant

## 2017-06-13 VITALS — BP 149/95 | HR 86 | Temp 97.7°F | Ht 73.0 in | Wt 367.8 lb

## 2017-06-13 DIAGNOSIS — D72829 Elevated white blood cell count, unspecified: Secondary | ICD-10-CM | POA: Diagnosis not present

## 2017-06-13 DIAGNOSIS — K5792 Diverticulitis of intestine, part unspecified, without perforation or abscess without bleeding: Secondary | ICD-10-CM | POA: Insufficient documentation

## 2017-06-13 DIAGNOSIS — R7989 Other specified abnormal findings of blood chemistry: Secondary | ICD-10-CM

## 2017-06-13 DIAGNOSIS — R945 Abnormal results of liver function studies: Secondary | ICD-10-CM

## 2017-06-13 MED ORDER — HYDROCODONE-ACETAMINOPHEN 5-325 MG PO TABS: 1.0000 | ORAL_TABLET | Freq: Four times a day (QID) | ORAL | 0 refills | Status: DC | PRN

## 2017-06-13 MED ORDER — KETOROLAC TROMETHAMINE 60 MG/2ML IM SOLN
60.0000 mg | Freq: Once | INTRAMUSCULAR | Status: AC
  Administered 2017-06-13: 60 mg via INTRAMUSCULAR

## 2017-06-13 MED ORDER — METRONIDAZOLE 500 MG PO TABS: 500.0000 mg | ORAL_TABLET | Freq: Three times a day (TID) | ORAL | 0 refills | Status: DC

## 2017-06-13 MED ORDER — LEVOFLOXACIN 500 MG PO TABS: 500.0000 mg | ORAL_TABLET | Freq: Every day | ORAL | 0 refills | Status: DC

## 2017-06-13 NOTE — Progress Notes (Signed)
.   BP (!) 149/95   Pulse 86   Temp 97.7 F (36.5 C) (Oral)   Ht 6\' 1"  (1.854 m)   Wt (!) 367 lb 12.8 oz (166.8 kg)   BMI 48.53 kg/m    Subjective:    Patient ID: Casey Hoover, male    DOB: [birth year], 36 y.o.   MRN: [id number]  HPI: Casey Hoover is a 36 y.o. male presenting on 06/13/2017 for Abdominal Pain and Diarrhea  Five days of diarrhea and left ower quadrant pain. No prior history of diverticulitis.  He has been taking pepto-bismol and created black stools. He has also been taking ibuprofen with minimal relief.  Pain is severe at times and diarrhea throughout the day.   He has no vomiting.  He had also stopped his BP med because it was doing well. He will start taking again. No family history of diverticulitis.  Relevant past medical, surgical, family and social history reviewed and updated as indicated. Allergies and medications reviewed and updated.  Past Medical History:  Diagnosis Date  . Gout   . HTN (hypertension)   . OSA (obstructive sleep apnea) 08/04/2014    Past Surgical History:  Procedure Laterality Date  . WISDOM TOOTH EXTRACTION  2010    Review of Systems  Constitutional: Positive for fatigue. Negative for appetite change, chills, diaphoresis and fever.  HENT: Negative.   Eyes: Negative.  Negative for pain and visual disturbance.  Respiratory: Negative.  Negative for cough, chest tightness, shortness of breath and wheezing.   Cardiovascular: Negative.  Negative for chest pain, palpitations and leg swelling.  Gastrointestinal: Positive for abdominal distention, abdominal pain, diarrhea and nausea. Negative for anal bleeding, blood in stool, constipation and vomiting.  Endocrine: Negative.   Genitourinary: Negative.   Musculoskeletal: Negative.   Skin: Negative.  Negative for color change and rash.  Neurological: Negative.  Negative for weakness, numbness and headaches.  Hematological: Negative.   Psychiatric/Behavioral: Negative.      Allergies as of 06/13/2017      Reactions   Sulfa Antibiotics Rash      Medication List       Accurate as of 06/13/17  9:06 AM. Always use your most recent med list.          allopurinol 100 MG tablet Commonly known as:  ZYLOPRIM Take 100 mg by mouth daily.   COLCRYS 0.6 MG tablet Generic drug:  colchicine Take 0.6 mg by mouth daily. Gout flare   HYDROcodone-acetaminophen 5-325 MG tablet Commonly known as:  NORCO/VICODIN Take 1 tablet by mouth every 6 (six) hours as needed for moderate pain.   ibuprofen 200 MG tablet Commonly known as:  ADVIL,MOTRIN Take 400 mg by mouth every 6 (six) hours as needed for headache.   levofloxacin 500 MG tablet Commonly known as:  LEVAQUIN Take 1 tablet (500 mg total) by mouth daily.   losartan 100 MG tablet Commonly known as:  COZAAR Take 1 tablet (100 mg total) by mouth daily.   metroNIDAZOLE 500 MG tablet Commonly known as:  FLAGYL Take 1 tablet (500 mg total) by mouth 3 (three) times daily.   nitroGLYCERIN 0.4 MG SL tablet Commonly known as:  NITROSTAT Place 1 tablet (0.4 mg total) under the tongue every 5 (five) minutes as needed for chest pain.   ranitidine 150 MG tablet Commonly known as:  ZANTAC Take 150 mg by mouth daily as needed for heartburn.            Discharge  Care Instructions        Start     Ordered   06/13/17 0000  levofloxacin (LEVAQUIN) 500 MG tablet  Daily    Question:  Supervising Provider  Answer:  Timmothy Euler   06/13/17 0856   06/13/17 0000  metroNIDAZOLE (FLAGYL) 500 MG tablet  3 times daily    Question:  Supervising Provider  Answer:  Timmothy Euler   06/13/17 0856   06/13/17 0000  HYDROcodone-acetaminophen (NORCO/VICODIN) 5-325 MG tablet  Every 6 hours PRN    Question:  Supervising Provider  Answer:  Timmothy Euler   06/13/17 0856   06/13/17 0000  CBC with Differential     06/13/17 0900   06/13/17 0000  CMP14+EGFR     06/13/17 0900         Objective:    BP (!)  149/95   Pulse 86   Temp 97.7 F (36.5 C) (Oral)   Ht '6\' 1"'$  (1.854 m)   Wt (!) 367 lb 12.8 oz (166.8 kg)   BMI 48.53 kg/m   Allergies  Allergen Reactions  . Sulfa Antibiotics Rash    Physical Exam  Constitutional: He appears well-developed and well-nourished. He appears distressed.  HENT:  Head: Normocephalic and atraumatic.  Eyes: Pupils are equal, round, and reactive to light. Conjunctivae and EOM are normal.  Neck: Normal range of motion. Neck supple.  Cardiovascular: Normal rate, regular rhythm and normal heart sounds.   Pulmonary/Chest: Effort normal and breath sounds normal.  Abdominal: Soft. Bowel sounds are normal. He exhibits distension. He exhibits no mass. There is tenderness. There is rebound. There is no guarding.  Musculoskeletal: Normal range of motion.  Skin: Skin is warm and dry. He is not diaphoretic.  Nursing note and vitals reviewed.       Assessment & Plan:   1. Diverticulitis - levofloxacin (LEVAQUIN) 500 MG tablet; Take 1 tablet (500 mg total) by mouth daily.  Dispense: 10 tablet; Refill: 0 - metroNIDAZOLE (FLAGYL) 500 MG tablet; Take 1 tablet (500 mg total) by mouth 3 (three) times daily.  Dispense: 30 tablet; Refill: 0 - HYDROcodone-acetaminophen (NORCO/VICODIN) 5-325 MG tablet; Take 1 tablet by mouth every 6 (six) hours as needed for moderate pain.  Dispense: 30 tablet; Refill: 0 - CBC with Differential - CMP14+EGFR    Current Outpatient Prescriptions:  .  allopurinol (ZYLOPRIM) 100 MG tablet, Take 100 mg by mouth daily., Disp: , Rfl:  .  COLCRYS 0.6 MG tablet, Take 0.6 mg by mouth daily. Gout flare, Disp: , Rfl: 4 .  ibuprofen (ADVIL,MOTRIN) 200 MG tablet, Take 400 mg by mouth every 6 (six) hours as needed for headache., Disp: , Rfl:  .  ranitidine (ZANTAC) 150 MG tablet, Take 150 mg by mouth daily as needed for heartburn., Disp: , Rfl:  .  HYDROcodone-acetaminophen (NORCO/VICODIN) 5-325 MG tablet, Take 1 tablet by mouth every 6 (six) hours as  needed for moderate pain., Disp: 30 tablet, Rfl: 0 .  levofloxacin (LEVAQUIN) 500 MG tablet, Take 1 tablet (500 mg total) by mouth daily., Disp: 10 tablet, Rfl: 0 .  losartan (COZAAR) 100 MG tablet, Take 1 tablet (100 mg total) by mouth daily. (Patient not taking: Reported on 06/13/2017), Disp: 30 tablet, Rfl: 5 .  metroNIDAZOLE (FLAGYL) 500 MG tablet, Take 1 tablet (500 mg total) by mouth 3 (three) times daily., Disp: 30 tablet, Rfl: 0 .  nitroGLYCERIN (NITROSTAT) 0.4 MG SL tablet, Place 1 tablet (0.4 mg total) under the tongue every 5 (  five) minutes as needed for chest pain. (Patient not taking: Reported on 06/13/2017), Disp: 25 tablet, Rfl: 3 Continue all other maintenance medications as listed above.  Follow up plan: Return in about 5 years (around 06/13/2022) for recheck.  Educational handout given for diverticulitis  Terald Sleeper PA-C [place] [address]  [place], [place] [address] [phone]   06/13/2017, 9:06 AM

## 2017-06-13 NOTE — Patient Instructions (Signed)

## 2017-06-13 NOTE — Addendum Note (Signed)
Addended by: Tamera Punt on: 06/13/2017 09:27 AM   Modules accepted: Orders

## 2017-06-14 LAB — CMP14+EGFR
A/G RATIO: 1.7 (ref 1.2–2.2)
ALT: 47 IU/L — AB (ref 0–44)
AST: 25 IU/L (ref 0–40)
Albumin: 4.3 g/dL (ref 3.5–5.5)
Alkaline Phosphatase: 75 IU/L (ref 39–117)
BILIRUBIN TOTAL: 0.7 mg/dL (ref 0.0–1.2)
BUN/Creatinine Ratio: 8 — ABNORMAL LOW (ref 9–20)
BUN: 11 mg/dL (ref 6–20)
CALCIUM: 9.5 mg/dL (ref 8.7–10.2)
CHLORIDE: 105 mmol/L (ref 96–106)
CO2: 26 mmol/L (ref 20–29)
Creatinine, Ser: 1.38 mg/dL — ABNORMAL HIGH (ref 0.76–1.27)
GFR calc Af Amer: 77 mL/min/{1.73_m2} (ref >59)
GFR calc non Af Amer: 66 mL/min/{1.73_m2} (ref >59)
GLUCOSE: 129 mg/dL — AB (ref 65–99)
Globulin, Total: 2.6 g/dL (ref 1.5–4.5)
POTASSIUM: 4.9 mmol/L (ref 3.5–5.2)
SODIUM: 144 mmol/L (ref 134–144)
TOTAL PROTEIN: 6.9 g/dL (ref 6.0–8.5)

## 2017-06-14 LAB — CBC WITH DIFFERENTIAL/PLATELET
BASOS ABS: 0.1 10*3/uL (ref 0.0–0.2)
BASOS: 1 %
EOS (ABSOLUTE): 0.4 10*3/uL (ref 0.0–0.4)
Eos: 4 %
Hematocrit: 49.2 % (ref 37.5–51.0)
Hemoglobin: 16.6 g/dL (ref 13.0–17.7)
IMMATURE GRANS (ABS): 0.1 10*3/uL (ref 0.0–0.1)
IMMATURE GRANULOCYTES: 1 %
LYMPHS: 15 %
Lymphocytes Absolute: 1.8 10*3/uL (ref 0.7–3.1)
MCH: 31 pg (ref 26.6–33.0)
MCHC: 33.7 g/dL (ref 31.5–35.7)
MCV: 92 fL (ref 79–97)
MONOCYTES: 8 %
Monocytes Absolute: 0.9 10*3/uL (ref 0.1–0.9)
NEUTROS PCT: 71 %
Neutrophils Absolute: 8.5 10*3/uL — ABNORMAL HIGH (ref 1.4–7.0)
PLATELETS: 227 10*3/uL (ref 150–379)
RBC: 5.35 x10E6/uL (ref 4.14–5.80)
RDW: 13.3 % (ref 12.3–15.4)
WBC: 11.8 10*3/uL — ABNORMAL HIGH (ref 3.4–10.8)

## 2017-06-17 NOTE — Addendum Note (Signed)
Addended by: Tamera Punt on: 06/17/2017 10:59 AM   Modules accepted: Orders

## 2017-11-24 ENCOUNTER — Encounter: Payer: Self-pay | Admitting: Family Medicine

## 2017-11-24 ENCOUNTER — Ambulatory Visit (INDEPENDENT_AMBULATORY_CARE_PROVIDER_SITE_OTHER): Payer: 59 | Admitting: Family Medicine

## 2017-11-24 VITALS — BP 167/87 | HR 85 | Temp 99.3°F | Ht 73.0 in | Wt 381.0 lb

## 2017-11-24 DIAGNOSIS — R509 Fever, unspecified: Secondary | ICD-10-CM

## 2017-11-24 DIAGNOSIS — J111 Influenza due to unidentified influenza virus with other respiratory manifestations: Secondary | ICD-10-CM | POA: Diagnosis not present

## 2017-11-24 LAB — VERITOR FLU A/B WAIVED
Influenza A: NEGATIVE
Influenza B: NEGATIVE

## 2017-11-24 MED ORDER — OSELTAMIVIR PHOSPHATE 75 MG PO CAPS: 75.0000 mg | ORAL_CAPSULE | Freq: Two times a day (BID) | ORAL | 0 refills | Status: DC

## 2017-11-24 NOTE — Progress Notes (Signed)
Chief Complaint  Patient presents with  . Fever    pt here today c/o fever and feeling     HPI  Patient presents today for patient presents with dry cough runny stuffy nose. Diffuse headache of moderate intensity. Patient also has chills and subjective fever. Body aches worst in the back but present in the legs, shoulders, and torso as well. Has sapped the energy to the point that of being unable to perform usual activities other than ADLs. Onset 3 days ago.  PMH: Smoking status noted ROS: Per HPI  Objective: BP (!) 167/87   Pulse 85   Temp 99.3 F (37.4 C) (Oral)   Ht 6\' 1"  (1.854 m)   Wt (!) 381 lb (172.8 kg)   BMI 50.27 kg/m  Gen: NAD, alert, cooperative with exam HEENT: NCAT, Nasal passages swollen, red TMS RED CV: RRR, good S1/S2, no murmur Resp: Bronchitis changes with scattered wheezes, non-labored Ext: No edema, warm Neuro: Alert and oriented, No gross deficits  Assessment and plan:  1. Fever, unspecified fever cause   2. Influenza with respiratory manifestation     Meds ordered this encounter  Medications  . oseltamivir (TAMIFLU) 75 MG capsule    Sig: Take 1 capsule (75 mg total) by mouth 2 (two) times daily.    Dispense:  10 capsule    Refill:  0    Orders Placed This Encounter  Procedures  . Veritor Flu A/B Waived    Order Specific Question:   Source    Answer:   nasal    Follow up as needed.  Mechele ClaudeWarren Merica Prell, MD

## 2017-11-29 ENCOUNTER — Ambulatory Visit (INDEPENDENT_AMBULATORY_CARE_PROVIDER_SITE_OTHER): Payer: 59 | Admitting: Pediatrics

## 2017-11-29 ENCOUNTER — Encounter: Payer: Self-pay | Admitting: Pediatrics

## 2017-11-29 VITALS — BP 174/97 | HR 86 | Temp 98.2°F | Ht 73.0 in | Wt 383.4 lb

## 2017-11-29 DIAGNOSIS — G4733 Obstructive sleep apnea (adult) (pediatric): Secondary | ICD-10-CM

## 2017-11-29 DIAGNOSIS — I10 Essential (primary) hypertension: Secondary | ICD-10-CM | POA: Diagnosis not present

## 2017-11-29 DIAGNOSIS — J111 Influenza due to unidentified influenza virus with other respiratory manifestations: Secondary | ICD-10-CM | POA: Diagnosis not present

## 2017-11-29 NOTE — Progress Notes (Signed)
  Subjective:   Patient ID: Casey Hoover, male    DOB: 1980-12-15, 37 y.o.   MRN: [id number] CC: flu symptom f/u  HPI: Casey Hoover is a 37 y.o. male presenting for Fever (was seen 11/24/17. 100.4 yesterday 4pm); Cough; and Nasal Congestion  Diagnosed with flu 2/4, symptoms started 3 days before prior OV. Here today for note OK to return to work. He has been feeling better, not back to normal self. Still with low grade fever yesterday, none today. Says he has to get back to work at [employer]. Still with congestion.  Taking dayquil, mucinex, OTC cold medications.  HTN: can check at home, hasnt recently. No HA, no CP.  OSA: has CPAP machine at home, not able to use it due to discomfort  Elevated BMI: wants to lose weight, other family members do cooking, grocery shopping. He does drink some sodas daily  Relevant past medical, surgical, family and social history reviewed. Allergies and medications reviewed and updated. Social History   Tobacco Use  Smoking Status Current Every Day Smoker  . Packs/day: 1.00  . Years: 15.00  . Pack years: 15.00  . Types: Cigarettes  Smokeless Tobacco Never Used   ROS: Per HPI   Objective:    BP (!) 174/97   Pulse 86   Temp 98.2 F (36.8 C) (Oral)   Ht 6\' 1"  (1.854 m)   Wt (!) 383 lb 6.4 oz (173.9 kg)   BMI 50.58 kg/m   Wt Readings from Last 3 Encounters:  11/29/17 (!) 383 lb 6.4 oz (173.9 kg)  11/24/17 (!) 381 lb (172.8 kg)  06/13/17 (!) 367 lb 12.8 oz (166.8 kg)    Gen: NAD, alert, cooperative with exam, NCAT EYES: EOMI, no conjunctival injection, or no icterus ENT:  R TM with erythema, L TM dull gray b/l, OP crowded with erythema LYMPH: no cervical LAD CV: NRRR, normal S1/S2, no murmur, distal pulses 2+ b/l Resp: CTABL, no wheezes, normal WOB Ext: No edema, warm Neuro: Alert and oriented, strength equal b/l UE and LE, coordination grossly normal MSK: normal muscle bulk  Assessment & Plan:  Casey FearingJames was seen today for  fever, cough and nasal congestion.  Diagnoses and all orders for this visit:  Influenza Some symptoms improved, no fevers, pt wants to get back to work, note given.  OSA (obstructive sleep apnea) Elevated BMI Wants to lose weight Discussed can be hard with untreated sleep apnea, ideally would find way to treat apnea. Pt to cut out regular sodas, will f/u with PCP soon  Essential hypertension Elevated today, asymptomatic, has been taking dayquil, decongestants OTC regularly. Pt to check BP at home, write down, rtc 2 weeks. Cont current medicine.  Follow up plan: Return in about 2 weeks (around 12/13/2017). For blood pressure follow up. Rex Krasarol Vincent, MD [facility]

## 2017-11-29 NOTE — Patient Instructions (Signed)
Check blood pressure at home  Come back in 3 weeks

## 2017-12-26 ENCOUNTER — Telehealth: Payer: Self-pay | Admitting: Family Medicine

## 2017-12-26 ENCOUNTER — Other Ambulatory Visit: Payer: Self-pay | Admitting: *Deleted

## 2017-12-26 MED ORDER — COLCHICINE 0.6 MG PO TABS: 0.6000 mg | ORAL_TABLET | Freq: Every day | ORAL | 0 refills | Status: DC

## 2017-12-26 NOTE — Telephone Encounter (Signed)
Med sent in ( he is within the 30 days since dismissal)

## 2017-12-26 NOTE — Telephone Encounter (Signed)
What symptoms do you have? Big toe on right foot  How long have you been sick? Today  Have you been seen for this problem? NO  If your provider decides to give you a prescription, which pharmacy would you like for it to be sent to[place] [facility] [place]   Patient informed that this information will be sent to the clinical staff for review and that they should receive a follow up call.

## 2017-12-26 NOTE — Telephone Encounter (Signed)
Patient has been dismissed.

## 2018-01-07 ENCOUNTER — Other Ambulatory Visit: Payer: Self-pay | Admitting: Nurse Practitioner

## 2018-02-01 ENCOUNTER — Other Ambulatory Visit: Payer: Self-pay | Admitting: Family Medicine

## 2018-04-02 ENCOUNTER — Other Ambulatory Visit: Payer: Self-pay | Admitting: Family Medicine

## 2018-07-16 ENCOUNTER — Encounter: Payer: Self-pay | Admitting: Family Medicine

## 2018-07-20 ENCOUNTER — Encounter: Payer: Self-pay | Admitting: Family Medicine

## 2018-07-20 ENCOUNTER — Ambulatory Visit (INDEPENDENT_AMBULATORY_CARE_PROVIDER_SITE_OTHER): Payer: 59 | Admitting: Family Medicine

## 2018-07-20 VITALS — BP 142/90 | HR 91 | Temp 98.0°F | Ht 73.0 in | Wt 369.0 lb

## 2018-07-20 DIAGNOSIS — Z0000 Encounter for general adult medical examination without abnormal findings: Secondary | ICD-10-CM | POA: Diagnosis not present

## 2018-07-20 DIAGNOSIS — I10 Essential (primary) hypertension: Secondary | ICD-10-CM | POA: Diagnosis not present

## 2018-07-20 NOTE — Progress Notes (Signed)
Subjective:  Patient ID: Casey Hoover, male    DOB: [birth year]  Age: 37 y.o. MRN: [id number]  CC: Annual Exam (had urine checked at work last week and was told by nurse there was glucose and blood; has been checking blood sugar at home and it is elevated, 217 fasting yesterday)   HPI Casey Hoover presents for annual exam but he is been having some elevated blood sugars noted as well.  217 yesterday fasting.  Check on a friend's monitor at home he has been heavy all of his life.  He was 330 pounds when he finished high school.  He got down to 240 at one time but has edged back up.  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  He is working all the time.  He was in the last year due to a loss of energy and got frustrated by the lack of ability to find what the problem was.  He still has some energy issues but with a high sugar wants to have that checked as well.  Depression screen Casey Hoover 2/9 11/29/2017 11/24/2017 06/13/2017  Decreased Interest 0 0 0  Down, Depressed, Hopeless 0 0 0  PHQ - 2 Score 0 0 0  Altered sleeping - - -  Tired, decreased energy - - -  Change in appetite - - -  Feeling bad or failure about yourself  - - -  Trouble concentrating - - -  Moving slowly or fidgety/restless - - -  Suicidal thoughts - - -  PHQ-9 Score - - -    History Casey Hoover has a past medical history of Gout, HTN (hypertension), and OSA (obstructive sleep apnea) (08/04/2014).   He has a past surgical history that includes Wisdom tooth extraction (2010).   His family history includes Diabetes in his mother and other; Heart Problems in his other; Heart attack in his father; Hypertension in his father and other.He reports that he has been smoking cigarettes. He has a 15.00 pack-year smoking history. He has never used smokeless tobacco. He reports that he drinks alcohol. He reports that he does not  use drugs.    ROS Review of Systems  Constitutional: Positive for fatigue. Negative for activity change and unexpected weight change.  HENT: Negative for congestion, ear pain, hearing loss, postnasal drip and trouble swallowing.   Eyes: Negative for pain and visual disturbance.  Respiratory: Negative for cough, chest tightness and shortness of breath.   Cardiovascular: Negative for chest pain, palpitations and leg swelling.  Gastrointestinal: Negative for abdominal distention, abdominal pain, blood in stool, constipation, diarrhea, nausea and vomiting.  Endocrine: Positive for polydipsia. Negative for cold intolerance and heat intolerance.  Genitourinary: Negative for difficulty urinating, dysuria, flank pain, frequency and urgency.  Musculoskeletal: Negative for arthralgias and joint swelling.  Skin: Negative for color change, rash and wound.  Neurological: Negative for dizziness, syncope, speech difficulty, weakness, light-headedness, numbness and headaches.  Hematological: Does not bruise/bleed easily.  Psychiatric/Behavioral: Negative for confusion, decreased concentration, dysphoric mood and sleep disturbance. The patient is not nervous/anxious.     Objective:  BP (!) 142/90   Pulse 91   Temp 98 F (36.7 C) (Oral)   Ht _0  (1.854 m)   Wt (!) 369 lb (167.4 kg)   BMI 48.68 kg/m   BP Readings from Last 3 Encounters:  07/20/18 (!) 142/90  11/29/17 (!) 174/97  11/24/17 (!) 167/87    Wt Readings from Last 3 Encounters:  07/20/18 (!) 369 lb (167.4 kg)  11/29/17 (!) 383 lb 6.4 oz (173.9 kg)  11/24/17 (!) 381 lb (172.8 kg)     Physical Exam  Constitutional: He is oriented to person, place, and time. He appears well-developed. No distress.  Morbidly obese  HENT:  Head: Normocephalic and atraumatic.  Mouth/Throat: Oropharynx is clear and moist.  Eyes: Pupils are equal, round, and reactive to light. EOM are normal.  Neck: Normal range of motion. No tracheal deviation  present. No thyromegaly present.  Cardiovascular: Normal rate, regular rhythm and normal heart sounds. Exam reveals no gallop and no friction rub.  No murmur heard. Pulmonary/Chest: Breath sounds normal. He has no wheezes. He has no rales.  Abdominal: Soft. Bowel sounds are normal. He exhibits no distension and no mass. There is no tenderness. Hernia confirmed negative in the right inguinal area and confirmed negative in the left inguinal area.  Genitourinary: Testes normal and penis normal.  Musculoskeletal: Normal range of motion. He exhibits no edema.  Lymphadenopathy:    He has no cervical adenopathy.  Neurological: He is alert and oriented to person, place, and time.  Skin: Skin is warm and dry.  Psychiatric: He has a normal mood and affect.      Assessment & Plan:   Casey Hoover was seen today for annual exam.  Diagnoses and all orders for this visit:  Well adult exam  Essential hypertension -     Cortisol; Future -     CBC with Differential/Platelet -     CMP14+EGFR -     Lipid panel -     TSH -     Testosterone,Free and Total -     Bayer DCA Hb A1c Waived -     Urinalysis  Morbid obesity (HCC)       I have discontinued Casey Hoover's ibuprofen, losartan, and oseltamivir. I am also having him maintain his allopurinol, nitroGLYCERIN, colchicine, and ranitidine.  Allergies as of 07/20/2018      Reactions   Sulfa Antibiotics Rash      Medication List        Accurate as of 07/20/18  6:06 PM. Always use your most recent med list.          allopurinol 100 MG tablet Commonly known as:  ZYLOPRIM Take 100 mg by mouth daily.   colchicine 0.6 MG tablet TAKE 2 TABLETS AT ONSET AND REPEAT 1 TABLET DOSE 1 HOUR LATER ONCE IN 24 HOURS FOR GOUT FLARE.   nitroGLYCERIN 0.4 MG SL tablet Commonly known as:  NITROSTAT Place 1 tablet (0.4 mg total) under the tongue every 5 (five) minutes as needed for chest pain.   ranitidine 150 MG tablet Commonly known as:  ZANTAC Take  150 mg by mouth 2 (two) times daily.      Treatment for potential diabetes will be determined once his hemoglobin A1c has been resulted. Follow-up: Return in about 1 month (around 08/19/2018).  Claretta Fraise, M.D.

## 2018-07-21 ENCOUNTER — Other Ambulatory Visit: Payer: Self-pay | Admitting: Family Medicine

## 2018-07-21 ENCOUNTER — Other Ambulatory Visit: Payer: 59

## 2018-07-21 LAB — URINALYSIS
BILIRUBIN UA: NEGATIVE
GLUCOSE, UA: NEGATIVE
KETONES UA: NEGATIVE
Leukocytes, UA: NEGATIVE
Nitrite, UA: NEGATIVE
SPEC GRAV UA: 1.025 (ref 1.005–1.030)
UUROB: 0.2 mg/dL (ref 0.2–1.0)
pH, UA: 6 (ref 5.0–7.5)

## 2018-07-21 LAB — BAYER DCA HB A1C WAIVED: HB A1C: 8.7 % — AB (ref <7.0)

## 2018-07-21 MED ORDER — METFORMIN HCL ER 750 MG PO TB24: 750.0000 mg | ORAL_TABLET | Freq: Every day | ORAL | 1 refills | Status: DC

## 2018-07-22 LAB — CBC WITH DIFFERENTIAL/PLATELET
BASOS: 1 %
Basophils Absolute: 0.1 10*3/uL (ref 0.0–0.2)
EOS (ABSOLUTE): 0.1 10*3/uL (ref 0.0–0.4)
EOS: 1 %
HEMATOCRIT: 45.5 % (ref 37.5–51.0)
Hemoglobin: 15.5 g/dL (ref 13.0–17.7)
IMMATURE GRANS (ABS): 0.1 10*3/uL (ref 0.0–0.1)
IMMATURE GRANULOCYTES: 1 %
Lymphocytes Absolute: 2 10*3/uL (ref 0.7–3.1)
Lymphs: 17 %
MCH: 30.2 pg (ref 26.6–33.0)
MCHC: 34.1 g/dL (ref 31.5–35.7)
MCV: 89 fL (ref 79–97)
MONOCYTES: 5 %
Monocytes Absolute: 0.6 10*3/uL (ref 0.1–0.9)
NEUTROS PCT: 75 %
Neutrophils Absolute: 8.7 10*3/uL — ABNORMAL HIGH (ref 1.4–7.0)
PLATELETS: 229 10*3/uL (ref 150–450)
RBC: 5.14 x10E6/uL (ref 4.14–5.80)
RDW: 13.5 % (ref 12.3–15.4)
WBC: 11.7 10*3/uL — AB (ref 3.4–10.8)

## 2018-07-22 LAB — CMP14+EGFR
A/G RATIO: 1.7 (ref 1.2–2.2)
ALK PHOS: 123 IU/L — AB (ref 39–117)
ALT: 62 IU/L — ABNORMAL HIGH (ref 0–44)
AST: 29 IU/L (ref 0–40)
Albumin: 4.5 g/dL (ref 3.5–5.5)
BUN/Creatinine Ratio: 8 — ABNORMAL LOW (ref 9–20)
BUN: 9 mg/dL (ref 6–20)
Bilirubin Total: 0.7 mg/dL (ref 0.0–1.2)
CHLORIDE: 98 mmol/L (ref 96–106)
CO2: 23 mmol/L (ref 20–29)
Calcium: 9.7 mg/dL (ref 8.7–10.2)
Creatinine, Ser: 1.11 mg/dL (ref 0.76–1.27)
GFR calc Af Amer: 99 mL/min/{1.73_m2} (ref >59)
GFR calc non Af Amer: 86 mL/min/{1.73_m2} (ref >59)
GLOBULIN, TOTAL: 2.6 g/dL (ref 1.5–4.5)
Glucose: 205 mg/dL — ABNORMAL HIGH (ref 65–99)
POTASSIUM: 4.7 mmol/L (ref 3.5–5.2)
SODIUM: 138 mmol/L (ref 134–144)
Total Protein: 7.1 g/dL (ref 6.0–8.5)

## 2018-07-22 LAB — TSH: TSH: 1.82 u[IU]/mL (ref 0.450–4.500)

## 2018-07-22 LAB — LIPID PANEL
CHOLESTEROL TOTAL: 174 mg/dL (ref 100–199)
Chol/HDL Ratio: 7 ratio — ABNORMAL HIGH (ref 0.0–5.0)
HDL: 25 mg/dL — ABNORMAL LOW (ref >39)
LDL Calculated: 89 mg/dL (ref 0–99)
Triglycerides: 298 mg/dL — ABNORMAL HIGH (ref 0–149)
VLDL Cholesterol Cal: 60 mg/dL — ABNORMAL HIGH (ref 5–40)

## 2018-07-22 LAB — TESTOSTERONE,FREE AND TOTAL
TESTOSTERONE FREE: 5.9 pg/mL — AB (ref 8.7–25.1)
Testosterone: 132 ng/dL — ABNORMAL LOW (ref 264–916)

## 2018-07-23 ENCOUNTER — Encounter: Payer: Self-pay | Admitting: Family Medicine

## 2018-07-23 ENCOUNTER — Ambulatory Visit (INDEPENDENT_AMBULATORY_CARE_PROVIDER_SITE_OTHER): Payer: 59 | Admitting: Family Medicine

## 2018-07-23 VITALS — BP 122/77 | HR 83 | Ht 73.0 in | Wt 370.0 lb

## 2018-07-23 DIAGNOSIS — Z23 Encounter for immunization: Secondary | ICD-10-CM

## 2018-07-23 DIAGNOSIS — R5383 Other fatigue: Secondary | ICD-10-CM | POA: Diagnosis not present

## 2018-07-23 DIAGNOSIS — I10 Essential (primary) hypertension: Secondary | ICD-10-CM | POA: Diagnosis not present

## 2018-07-23 DIAGNOSIS — E1165 Type 2 diabetes mellitus with hyperglycemia: Secondary | ICD-10-CM

## 2018-07-23 NOTE — Patient Instructions (Signed)

## 2018-07-23 NOTE — Progress Notes (Signed)
Chief Complaint  Patient presents with  . Follow-up    HPI  Patient presents today for consultation regarding new diagnoses of diabetes mellitus and hypogonadism.  Patient had been seen a few days ago and noted that his blood sugar has been high on multiple occasions.  Subsequently A1c confirmed diabetes with very poor control.  Additionally he has been complaining of some energy problems and and ED.  Therefore he was checked for hypogonadism and found to have a low testosterone.  PMH: Smoking status noted ROS: Per HPI  Objective: BP 122/77   Pulse 83   Ht 6\' 1"  (1.854 m)   Wt (!) 370 lb (167.8 kg)   BMI 48.82 kg/m  Gen: NAD, alert, cooperative with exam   Assessment and plan:  1. Type 2 diabetes mellitus with hyperglycemia, without long-term current use of insulin (HCC)   2. Essential hypertension   3. Need for immunization against influenza   4. Other fatigue     Patient was counseled extensively on multiple modalities of diabetic care.  Specifically we discussed monitoring his glucose twice daily and explained the difference between fasting and postprandial glucose management.  We reviewed medication.  He wants to get off of medications so I encouraged rather dramatic weight loss as his only possible remedy for that.  We discussed a carb controlled diet and discussed the difference between simple and complex carbs specifically there different rates of him digestion in the body and release into the bloodstream.  We also discussed the various kinds of foods that represented complex versus simple carbs.  Regular exercise was recommended.  He will also visit in nutrition.  Over 30 minutes was spent with the patient more than half in consultation regarding the above topics.  Orders Placed This Encounter  Procedures  . Flu Vaccine QUAD 36+ mos IM  . Vitamin B12    Standing Status:   Future    Number of Occurrences:   1    Standing Expiration Date:   07/24/2019  . Testosterone   Standing Status:   Future    Number of Occurrences:   1    Standing Expiration Date:   07/24/2019  . Referral to Nutrition and Diabetes Services    Referral Priority:   Routine    Referral Type:   Consultation    Referral Reason:   Specialty Services Required    Number of Visits Requested:   1    Follow up 1 month Mechele Claude, MD

## 2018-07-24 ENCOUNTER — Other Ambulatory Visit: Payer: 59

## 2018-07-25 LAB — TESTOSTERONE: TESTOSTERONE: 162 ng/dL — AB (ref 264–916)

## 2018-07-25 LAB — VITAMIN B12: VITAMIN B 12: 787 pg/mL (ref 232–1245)

## 2018-07-25 LAB — CORTISOL: Cortisol: 16.2 ug/dL

## 2018-07-27 ENCOUNTER — Other Ambulatory Visit: Payer: Self-pay | Admitting: Family Medicine

## 2018-07-27 MED ORDER — ANDROGEL 20.25 MG/1.25GM (1.62%) TD GEL: TRANSDERMAL | 5 refills | Status: DC

## 2018-07-28 ENCOUNTER — Encounter: Payer: Self-pay | Admitting: Family Medicine

## 2018-08-12 ENCOUNTER — Other Ambulatory Visit: Payer: Self-pay | Admitting: Family Medicine

## 2018-08-19 ENCOUNTER — Ambulatory Visit: Payer: 59 | Admitting: *Deleted

## 2018-08-25 ENCOUNTER — Other Ambulatory Visit: Payer: Self-pay | Admitting: Family Medicine

## 2018-08-25 ENCOUNTER — Ambulatory Visit (INDEPENDENT_AMBULATORY_CARE_PROVIDER_SITE_OTHER): Payer: 59 | Admitting: Family Medicine

## 2018-08-25 ENCOUNTER — Encounter: Payer: Self-pay | Admitting: Family Medicine

## 2018-08-25 ENCOUNTER — Ambulatory Visit: Payer: 59 | Admitting: Family Medicine

## 2018-08-25 VITALS — BP 154/93 | HR 79 | Temp 97.7°F | Ht 73.0 in | Wt 361.0 lb

## 2018-08-25 DIAGNOSIS — E1165 Type 2 diabetes mellitus with hyperglycemia: Secondary | ICD-10-CM | POA: Diagnosis not present

## 2018-08-25 MED ORDER — ACCU-CHEK AVIVA CONNECT W/DEVICE KIT: 1.0000 | PACK | Freq: Four times a day (QID) | 0 refills | Status: DC

## 2018-08-25 MED ORDER — METFORMIN HCL ER 750 MG PO TB24: 750.0000 mg | ORAL_TABLET | Freq: Two times a day (BID) | ORAL | 0 refills | Status: DC

## 2018-08-25 NOTE — Progress Notes (Signed)
Subjective:  Patient ID: Casey Hoover,  male    DOB: Mar 24, 1981  Age: 37 y.o.    CC: Medical Management of Chronic Issues   HPI Adolpho Meenach Hepner presents for  follow-up of diabetes.  Patient has not yet learned to check his blood sugar regularly.  He did have it checked and it was 92 a few days ago.  He does not yet have a monitor strips.  Patient is trying to learn how to take care of his diabetes.  He expresses that are normal and working on carb control.  He would like to see nutritionist.  He has lost 9 pounds this month. Patient denies symptoms such as excessive hunger or urinary frequency, excessive hunger, nausea No significant hypoglycemic spells noted. Medications reviewed. Pt reports taking them regularly. Pt. denies complication/adverse reaction today.    History Letrell has a past medical history of Gout, HTN (hypertension), and OSA (obstructive sleep apnea) (08/04/2014).   He has a past surgical history that includes Wisdom tooth extraction (2010).   His family history includes Diabetes in his mother and other; Heart Problems in his other; Heart attack in his father; Hypertension in his father and other.He reports that he has been smoking cigarettes. He has a 15.00 pack-year smoking history. He has never used smokeless tobacco. He reports that he drinks alcohol. He reports that he does not use drugs.  Current Outpatient Medications on File Prior to Visit  Medication Sig Dispense Refill  . allopurinol (ZYLOPRIM) 100 MG tablet Take 100 mg by mouth daily.    . ANDROGEL PUMP 20.25 MG/ACT (1.62%) GEL     . colchicine 0.6 MG tablet TAKE 2 TABLETS AT ONSET AND REPEAT 1 TABLET DOSE 1 HOUR LATER ONCE IN 24 HOURS FOR GOUT FLARE. 30 tablet 0  . diclofenac (VOLTAREN) 75 MG EC tablet Take 75 mg by mouth daily.  3  . ranitidine (ZANTAC) 150 MG tablet Take 150 mg by mouth 2 (two) times daily.     No current facility-administered medications on file prior to visit.      ROS Review of Systems  Constitutional: Negative for fever.  Respiratory: Negative for shortness of breath.   Cardiovascular: Negative for chest pain.  Musculoskeletal: Negative for arthralgias.  Skin: Negative for rash.    Objective:  BP (!) 154/93   Pulse 79   Temp 97.7 F (36.5 C) (Oral)   Ht _0  (1.854 m)   Wt (!) 361 lb (163.7 kg)   BMI 47.63 kg/m   BP Readings from Last 3 Encounters:  08/25/18 (!) 154/93  07/23/18 122/77  07/20/18 (!) 142/90    Wt Readings from Last 3 Encounters:  08/25/18 (!) 361 lb (163.7 kg)  07/23/18 (!) 370 lb (167.8 kg)  07/20/18 (!) 369 lb (167.4 kg)     Physical Exam  Diabetic Foot Exam - Simple   No data filed        Assessment & Plan:   Lehman was seen today for medical management of chronic issues.  Diagnoses and all orders for this visit:  Type 2 diabetes mellitus with hyperglycemia, without long-term current use of insulin ([place]) -     Referral to Nutrition and Diabetes Services  Other orders -     Blood Glucose Monitoring Suppl (ACCU-CHEK AVIVA CONNECT) w/Device KIT; 1 kit by Does not apply route QID. -     metFORMIN (GLUCOPHAGE-XR) 750 MG 24 hr tablet; Take 1 tablet (750 mg total) by mouth 2 (two)  times daily.   I have discontinued [place] nitroGLYCERIN and ANDROGEL. I have also changed his metFORMIN. Additionally, I am having him start on [place]. Lastly, I am having him maintain his allopurinol, colchicine, ranitidine, diclofenac, and ANDROGEL PUMP.  Meds ordered this encounter  Medications  . Blood Glucose Monitoring Suppl (ACCU-CHEK AVIVA CONNECT) w/Device KIT    Sig: 1 kit by Does not apply route QID.    Dispense:  1 kit    Refill:  0    DX E11.9  . metFORMIN (GLUCOPHAGE-XR) 750 MG 24 hr tablet    Sig: Take 1 tablet (750 mg total) by mouth 2 (two) times daily.    Dispense:  180 tablet    Refill:  0     Follow-up: Return in about 1 month (around 09/24/2018).  Claretta Fraise,  M.D.

## 2018-09-23 ENCOUNTER — Ambulatory Visit: Payer: 59 | Admitting: Family Medicine

## 2018-09-24 ENCOUNTER — Other Ambulatory Visit: Payer: Self-pay | Admitting: *Deleted

## 2018-09-24 MED ORDER — GLUCOSE BLOOD VI STRP: ORAL_STRIP | 12 refills | Status: DC

## 2018-10-01 ENCOUNTER — Other Ambulatory Visit: Payer: Self-pay | Admitting: Family Medicine

## 2018-10-26 ENCOUNTER — Ambulatory Visit: Payer: 59 | Admitting: Family Medicine

## 2018-10-30 ENCOUNTER — Ambulatory Visit: Payer: 59 | Admitting: Family Medicine

## 2018-11-14 ENCOUNTER — Other Ambulatory Visit: Payer: Self-pay | Admitting: Family Medicine

## 2018-12-12 ENCOUNTER — Other Ambulatory Visit: Payer: Self-pay | Admitting: Family Medicine

## 2018-12-14 NOTE — Telephone Encounter (Signed)
Stacks. NTBS 30 days given 11/16/18 

## 2018-12-14 NOTE — Telephone Encounter (Signed)
lmtcb-cb 2/24 

## 2019-01-05 ENCOUNTER — Ambulatory Visit (INDEPENDENT_AMBULATORY_CARE_PROVIDER_SITE_OTHER): Payer: 59 | Admitting: Family

## 2019-01-05 ENCOUNTER — Encounter: Payer: Self-pay | Admitting: Family

## 2019-01-05 ENCOUNTER — Telehealth: Payer: 59 | Admitting: Family

## 2019-01-05 ENCOUNTER — Other Ambulatory Visit: Payer: Self-pay

## 2019-01-05 VITALS — BP 141/76 | HR 83 | Temp 96.8°F | Ht 73.0 in | Wt 352.8 lb

## 2019-01-05 DIAGNOSIS — Z720 Tobacco use: Secondary | ICD-10-CM | POA: Diagnosis not present

## 2019-01-05 DIAGNOSIS — R05 Cough: Secondary | ICD-10-CM | POA: Diagnosis not present

## 2019-01-05 DIAGNOSIS — R059 Cough, unspecified: Secondary | ICD-10-CM

## 2019-01-05 DIAGNOSIS — J209 Acute bronchitis, unspecified: Secondary | ICD-10-CM

## 2019-01-05 DIAGNOSIS — J069 Acute upper respiratory infection, unspecified: Secondary | ICD-10-CM

## 2019-01-05 LAB — VERITOR FLU A/B WAIVED
Influenza A: NEGATIVE
Influenza B: NEGATIVE

## 2019-01-05 MED ORDER — PREDNISONE 10 MG (21) PO TBPK: ORAL_TABLET | ORAL | 0 refills | Status: DC

## 2019-01-05 MED ORDER — BENZONATATE 100 MG PO CAPS: 100.0000 mg | ORAL_CAPSULE | Freq: Two times a day (BID) | ORAL | 0 refills | Status: DC | PRN

## 2019-01-05 MED ORDER — FLUTICASONE PROPIONATE 50 MCG/ACT NA SUSP: 2.0000 | Freq: Every day | NASAL | 6 refills | Status: DC

## 2019-01-05 NOTE — Progress Notes (Signed)

## 2019-01-05 NOTE — Progress Notes (Signed)
Subjective:    Patient ID: Casey Hoover, male    DOB: 03/16/1981, 38 y.o.   MRN: [id number]  Chief Complaint  Patient presents with  . Cough    chest tightness  . Ear Pain  . some body aches    Cough  This is a new problem. The current episode started in the past 7 days. The problem has been gradually worsening. The problem occurs every few minutes. The cough is productive of sputum, productive of purulent sputum and productive of brown sputum. Associated symptoms include chills, ear congestion, ear pain, headaches, myalgias, nasal congestion, postnasal drip, rhinorrhea, a sore throat, shortness of breath and wheezing. Pertinent negatives include no fever. The symptoms are aggravated by lying down. Risk factors for lung disease include smoking/tobacco exposure. He has tried rest and OTC cough suppressant for the symptoms. The treatment provided mild relief.      Review of Systems  Constitutional: Positive for chills. Negative for fever.  HENT: Positive for ear pain, postnasal drip, rhinorrhea and sore throat.   Respiratory: Positive for cough, shortness of breath and wheezing.   Musculoskeletal: Positive for myalgias.  Neurological: Positive for headaches.  All other systems reviewed and are negative.      Objective:   Physical Exam Vitals signs reviewed.  Constitutional:      General: He is not in acute distress.    Appearance: He is well-developed. He is diaphoretic.  HENT:     Head: Normocephalic.     Right Ear: Tympanic membrane normal.     Left Ear: Tympanic membrane normal.     Nose: Congestion and rhinorrhea present.  Eyes:     General:        Right eye: No discharge.        Left eye: No discharge.     Pupils: Pupils are equal, round, and reactive to light.  Neck:     Musculoskeletal: Normal range of motion and neck supple.     Thyroid: No thyromegaly.  Cardiovascular:     Rate and Rhythm: Normal rate and regular rhythm.     Heart sounds: Normal heart  sounds. No murmur.  Pulmonary:     Effort: Pulmonary effort is normal. No respiratory distress.     Breath sounds: Decreased breath sounds and wheezing present.  Abdominal:     General: Bowel sounds are normal. There is no distension.     Palpations: Abdomen is soft.     Tenderness: There is no abdominal tenderness.  Musculoskeletal: Normal range of motion.        General: No tenderness.  Skin:    General: Skin is warm.     Findings: No erythema or rash.  Neurological:     Mental Status: He is alert and oriented to person, place, and time.     Cranial Nerves: No cranial nerve deficit.     Deep Tendon Reflexes: Reflexes are normal and symmetric.  Psychiatric:        Behavior: Behavior normal.        Thought Content: Thought content normal.        Judgment: Judgment normal.       BP (!) 141/76   Pulse 83   Temp (!) 96.8 F (36 C) (Oral)   Ht 6\' 1"  (1.854 m)   Wt (!) 352 lb 12.8 oz (160 kg)   BMI 46.55 kg/m      Assessment & Plan:  Tomar Clippard Sanden comes in today with chief complaint of  Cough (chest tightness); Ear Pain; and some body aches   Diagnosis and orders addressed:  1. Cough - Veritor Flu A/B Waived  2. Morbid obesity (HCC)  3. Tobacco abuse Smoking cessation discussed  4. Acute bronchitis, unspecified organism - Take meds as prescribed - Use a cool mist humidifier  -Use saline nose sprays frequently -Force fluids -For any cough or congestion  Use plain Mucinex- regular strength or max strength is fine -For fever or aces or pains- take tylenol or ibuprofen. -Throat lozenges if help -Discussed that it would be best to quarantine himself until symptoms resolve  - predniSONE (STERAPRED UNI-PAK 21 TAB) 10 MG (21) TBPK tablet; Use as directed  Dispense: 21 tablet; Refill: 0   Jannifer Rodney, FNP

## 2019-01-05 NOTE — Patient Instructions (Signed)

## 2019-01-11 ENCOUNTER — Telehealth: Payer: Self-pay | Admitting: Family Medicine

## 2019-01-11 NOTE — Telephone Encounter (Signed)
Okay to return to work. Please provide note. WS

## 2019-01-11 NOTE — Telephone Encounter (Signed)
Letter ready for pick up.  Left message  

## 2019-01-13 ENCOUNTER — Telehealth (INDEPENDENT_AMBULATORY_CARE_PROVIDER_SITE_OTHER): Payer: 59 | Admitting: Family Medicine

## 2019-01-13 ENCOUNTER — Other Ambulatory Visit: Payer: Self-pay

## 2019-01-13 DIAGNOSIS — R7989 Other specified abnormal findings of blood chemistry: Secondary | ICD-10-CM

## 2019-01-13 DIAGNOSIS — I10 Essential (primary) hypertension: Secondary | ICD-10-CM

## 2019-01-13 DIAGNOSIS — M1A9XX0 Chronic gout, unspecified, without tophus (tophi): Secondary | ICD-10-CM | POA: Diagnosis not present

## 2019-01-13 DIAGNOSIS — Z720 Tobacco use: Secondary | ICD-10-CM

## 2019-01-13 DIAGNOSIS — G4733 Obstructive sleep apnea (adult) (pediatric): Secondary | ICD-10-CM

## 2019-01-13 DIAGNOSIS — E1165 Type 2 diabetes mellitus with hyperglycemia: Secondary | ICD-10-CM | POA: Diagnosis not present

## 2019-01-13 DIAGNOSIS — R5382 Chronic fatigue, unspecified: Secondary | ICD-10-CM

## 2019-01-13 MED ORDER — ANDROGEL PUMP 20.25 MG/ACT (1.62%) TD GEL: 4.0000 | Freq: Every day | TRANSDERMAL | 5 refills | Status: DC

## 2019-01-13 MED ORDER — METFORMIN HCL ER 750 MG PO TB24: 750.0000 mg | ORAL_TABLET | Freq: Every day | ORAL | 5 refills | Status: DC

## 2019-01-13 MED ORDER — ALLOPURINOL 100 MG PO TABS: 100.0000 mg | ORAL_TABLET | Freq: Every day | ORAL | 1 refills | Status: DC

## 2019-01-13 MED ORDER — LEVOFLOXACIN 500 MG PO TABS: 500.0000 mg | ORAL_TABLET | Freq: Every day | ORAL | 0 refills | Status: DC

## 2019-01-13 MED ORDER — ALBUTEROL SULFATE HFA 108 (90 BASE) MCG/ACT IN AERS: 2.0000 | INHALATION_SPRAY | Freq: Four times a day (QID) | RESPIRATORY_TRACT | 0 refills | Status: DC | PRN

## 2019-01-13 MED ORDER — FEXOFENADINE-PSEUDOEPHED ER 180-240 MG PO TB24: 1.0000 | ORAL_TABLET | Freq: Every evening | ORAL | 11 refills | Status: DC

## 2019-01-13 NOTE — Progress Notes (Signed)
Virtual Visit via telephone Note  I connected with Casey Hoover on 01/13/19 at 10:10 by telephone and verified that I am speaking with the correct person using two identifiers. Casey Hoover is currently located at home . The provider, Claretta Fraise, MD is located in his office at time of visit.  I discussed the limitations, risks, security and privacy concerns of performing an evaluation and management service by telephone and the availability of in person appointments. I also discussed with the patient that there may be a patient responsible charge related to this service. The patient expressed understanding and agreed to proceed.  History of Present Illness     Subjective:  Patient ID: Casey Hoover, male    DOB: [birth year]  Age: 38 y.o. MRN: [id number]  CC: Follow up of Diabetes  HPI Casey Hoover presents forFollow-up of diabetes. Patient checks blood sugar at home.   80 fasting and 100 postprandial.  He has committed to a diet and lost 18 pounds in the last 6 months.  He just continues to have problems with energy and strength with a sensation of general weakness.  He denies any focal weakness, numbness, tingling or paresthesia. Patient denies symptoms such as polyuria, polydipsia, excessive hunger, nausea No significant hypoglycemic spells noted. Medications reviewed. Pt reports taking them regularly without complication/adverse reaction being reported today.  Patient tells me that he feels fairly well rested in the mornings.  He does continue to use his CPAP regularly. He has not had a flare of his gout in quite some time.  He is not currently using the allopurinol.  He has not needed the colchicine.  Additionally he says that using the testosterone for his low lab reading did not give him more energy so he discontinued that.  He expressed that his cough has actually worsened.  He is wheezing more and constantly short of breath in spite of the prednisone pack.   He continues to have copious clear rhinorrhea with the nonproductive cough.  He is self isolating for [place] virus but has not been tested.  He is using Mucinex D as well without improvement.  This is been going on since his office visit here last week.  That note was reviewed.  History Casey Hoover has a past medical history of Gout, HTN (hypertension), and OSA (obstructive sleep apnea) (08/04/2014).   He has a past surgical history that includes [place] tooth extraction (2010).   His family history includes Diabetes in his mother and another family member; Heart Problems in an other family member; Heart attack in his father; Hypertension in his father and another family member.He reports that he has been smoking cigarettes. He has a 15.00 pack-year smoking history. He has never used smokeless tobacco. He reports current alcohol use. He reports that he does not use drugs.  Current Outpatient Medications on File Prior to Visit  Medication Sig Dispense Refill  . ACCU-CHEK SOFTCLIX LANCETS lancets USE TO CHECK SUGAR 4 TIMES DAILY 100 each 0  . Blood Glucose Monitoring Suppl (ACCU-CHEK AVIVA CONNECT) w/Device KIT 1 kit by Does not apply route QID. 1 kit 0  . colchicine 0.6 MG tablet TAKE 2 TABLETS AT ONSET AND REPEAT 1 TABLET DOSE 1 HOUR LATER ONCE IN 24 HOURS FOR GOUT FLARE. (Patient not taking: Reported on 01/05/2019) 30 tablet 0  . fluticasone (FLONASE) 50 MCG/ACT nasal spray Place 2 sprays into both nostrils daily. (Patient not taking: Reported on 01/05/2019) 16 g 6  . glucose blood (  ACCU-CHEK AVIVA) test strip Use as instructed 100 each 12  . predniSONE (STERAPRED UNI-PAK 21 TAB) 10 MG (21) TBPK tablet Use as directed 21 tablet 0   No current facility-administered medications on file prior to visit.     ROS Review of Systems Thoroughly addressed within history of present illness. Objective:  There were no vitals taken for this visit.  BP Readings from Last 3 Encounters:  01/05/19 (!) 141/76   08/25/18 (!) 154/93  07/23/18 122/77    Wt Readings from Last 3 Encounters:  01/05/19 (!) 352 lb 12.8 oz (160 kg)  08/25/18 (!) 361 lb (163.7 kg)  07/23/18 (!) 370 lb (167.8 kg)     Physical Exam Deferred due to use of telephone visit.   Assessment & Plan:   Diagnoses and all orders for this visit:  Tobacco abuse  Morbid obesity ([place])  Chronic gout without tophus, unspecified cause, unspecified site  Chronic fatigue  Essential hypertension  OSA (obstructive sleep apnea)  Low testosterone  Other orders -     allopurinol (ZYLOPRIM) 100 MG tablet; Take 1 tablet (100 mg total) by mouth daily. -     fexofenadine-pseudoephedrine (ALLEGRA-D 24) 180-240 MG 24 hr tablet; Take 1 tablet by mouth every evening. For allergy and congestion -     ANDROGEL PUMP 20.25 MG/ACT (1.62%) GEL; Apply 4 Pump topically daily. -     albuterol (PROVENTIL HFA;VENTOLIN HFA) 108 (90 Base) MCG/ACT inhaler; Inhale 2 puffs into the lungs every 6 (six) hours as needed for wheezing or shortness of breath. -     levofloxacin (LEVAQUIN) 500 MG tablet; Take 1 tablet (500 mg total) by mouth daily. -     metFORMIN (GLUCOPHAGE-XR) 750 MG 24 hr tablet; Take 1 tablet (750 mg total) by mouth daily with breakfast. (Needs to be seen before next refill)      I have discontinued [place] diclofenac. I have also changed his allopurinol and AndroGel Pump. Additionally, I am having him start on fexofenadine-pseudoephedrine, albuterol, and levofloxacin. Lastly, I am having him maintain his colchicine, Accu-Chek Aviva Connect, glucose blood, Accu-Chek Softclix Lancets, fluticasone, predniSONE, and metFORMIN.  Meds ordered this encounter  Medications  . allopurinol (ZYLOPRIM) 100 MG tablet    Sig: Take 1 tablet (100 mg total) by mouth daily.    Dispense:  90 tablet    Refill:  1  . fexofenadine-pseudoephedrine (ALLEGRA-D 24) 180-240 MG 24 hr tablet    Sig: Take 1 tablet by mouth every evening. For allergy and  congestion    Dispense:  30 tablet    Refill:  11  . ANDROGEL PUMP 20.25 MG/ACT (1.62%) GEL    Sig: Apply 4 Pump topically daily.    Dispense:  150 g    Refill:  5  . albuterol (PROVENTIL HFA;VENTOLIN HFA) 108 (90 Base) MCG/ACT inhaler    Sig: Inhale 2 puffs into the lungs every 6 (six) hours as needed for wheezing or shortness of breath.    Dispense:  1 Inhaler    Refill:  0  . levofloxacin (LEVAQUIN) 500 MG tablet    Sig: Take 1 tablet (500 mg total) by mouth daily.    Dispense:  7 tablet    Refill:  0  . metFORMIN (GLUCOPHAGE-XR) 750 MG 24 hr tablet    Sig: Take 1 tablet (750 mg total) by mouth daily with breakfast. (Needs to be seen before next refill)    Dispense:  30 tablet    Refill:  5  Follow-up: No follow-ups on file.  Claretta Fraise, M.D.   Extensive Assessment and Plan: 1. Tobacco abuse   2. Morbid obesity ([place])   3. Chronic gout without tophus, unspecified cause, unspecified site   4. Chronic fatigue   5. Essential hypertension   6. OSA (obstructive sleep apnea)   7. Low testosterone     Meds ordered this encounter  Medications  . allopurinol (ZYLOPRIM) 100 MG tablet    Sig: Take 1 tablet (100 mg total) by mouth daily.    Dispense:  90 tablet    Refill:  1  . fexofenadine-pseudoephedrine (ALLEGRA-D 24) 180-240 MG 24 hr tablet    Sig: Take 1 tablet by mouth every evening. For allergy and congestion    Dispense:  30 tablet    Refill:  11  . ANDROGEL PUMP 20.25 MG/ACT (1.62%) GEL    Sig: Apply 4 Pump topically daily.    Dispense:  150 g    Refill:  5  . albuterol (PROVENTIL HFA;VENTOLIN HFA) 108 (90 Base) MCG/ACT inhaler    Sig: Inhale 2 puffs into the lungs every 6 (six) hours as needed for wheezing or shortness of breath.    Dispense:  1 Inhaler    Refill:  0  . levofloxacin (LEVAQUIN) 500 MG tablet    Sig: Take 1 tablet (500 mg total) by mouth daily.    Dispense:  7 tablet    Refill:  0  . metFORMIN (GLUCOPHAGE-XR) 750 MG 24 hr tablet     Sig: Take 1 tablet (750 mg total) by mouth daily with breakfast. (Needs to be seen before next refill)    Dispense:  30 tablet    Refill:  5     Follow Up Instructions:     I discussed the assessment and treatment plan with the patient. The patient was provided an opportunity to ask questions and all were answered. The patient agreed with the plan and demonstrated an understanding of the instructions.   The patient was advised to call back or seek an in-person evaluation if the symptoms worsen or if the condition fails to improve as anticipated.  The above assessment and management plan was discussed with the patient. The patient verbalized understanding of and has agreed to the management plan. Patient is aware to call the clinic if symptoms persist or worsen. Patient is aware when to return to the clinic for a follow-up visit. Patient educated on when it is appropriate to go to the emergency department.    I provided 26 minutes of non-face-to-face time during this encounter. Visit ended at 10:36.    Claretta Fraise, MD

## 2019-01-19 ENCOUNTER — Other Ambulatory Visit: Payer: Self-pay | Admitting: *Deleted

## 2019-01-19 MED ORDER — METFORMIN HCL ER 750 MG PO TB24: 750.0000 mg | ORAL_TABLET | Freq: Every day | ORAL | 1 refills | Status: DC

## 2019-01-20 ENCOUNTER — Telehealth: Payer: Self-pay | Admitting: Family Medicine

## 2019-01-20 NOTE — Telephone Encounter (Signed)
He will go back on SUN .  Needs note that he can go back - has been out since 3/17.  This will need to be on a [facility] form that he is bringing by.  Seen [facility] 3/17 and then stacks gave him another med.  Will have stacks fill out the form = as he is here in the office today.

## 2019-02-16 ENCOUNTER — Ambulatory Visit (INDEPENDENT_AMBULATORY_CARE_PROVIDER_SITE_OTHER): Payer: 59 | Admitting: Family Medicine

## 2019-02-16 ENCOUNTER — Encounter: Payer: Self-pay | Admitting: Family Medicine

## 2019-02-16 ENCOUNTER — Other Ambulatory Visit: Payer: Self-pay

## 2019-02-16 DIAGNOSIS — J329 Chronic sinusitis, unspecified: Secondary | ICD-10-CM | POA: Diagnosis not present

## 2019-02-16 DIAGNOSIS — H66009 Acute suppurative otitis media without spontaneous rupture of ear drum, unspecified ear: Secondary | ICD-10-CM | POA: Diagnosis not present

## 2019-02-16 DIAGNOSIS — J40 Bronchitis, not specified as acute or chronic: Secondary | ICD-10-CM

## 2019-02-16 MED ORDER — HYDROCODONE-HOMATROPINE 5-1.5 MG/5ML PO SYRP: 5.0000 mL | ORAL_SOLUTION | Freq: Four times a day (QID) | ORAL | 0 refills | Status: AC | PRN

## 2019-02-16 MED ORDER — AMOXICILLIN-POT CLAVULANATE 875-125 MG PO TABS: 1.0000 | ORAL_TABLET | Freq: Two times a day (BID) | ORAL | 0 refills | Status: DC

## 2019-02-16 MED ORDER — PREDNISONE 10 MG PO TABS: ORAL_TABLET | ORAL | 0 refills | Status: DC

## 2019-02-16 NOTE — Progress Notes (Signed)
Subjective:    Patient ID: Casey Hoover, male    DOB: 01/11/1981, 38 y.o.   MRN: [id number]   HPI: Casey Hoover is a 38 y.o. male presenting for awful earache. Uncontrollable cough. No relief with tessalon. Onset 2 days ago. Denies fever. (Checked every time he goes to work.) Still some dyspnea for climbin steps or a hill. Has been wheezing. Right ear pain. No affect on hearing. Throbbing. Congested.   Depression screen [facility] 2/9 01/05/2019 08/25/2018 07/23/2018 11/29/2017 11/24/2017  Decreased Interest 0 0 0 0 0  Down, Depressed, Hopeless 0 0 0 0 0  PHQ - 2 Score 0 0 0 0 0  Altered sleeping - - - - -  Tired, decreased energy - - - - -  Change in appetite - - - - -  Feeling bad or failure about yourself  - - - - -  Trouble concentrating - - - - -  Moving slowly or fidgety/restless - - - - -  Suicidal thoughts - - - - -  PHQ-9 Score - - - - -     Relevant past medical, surgical, family and social history reviewed and updated as indicated.  Interim medical history since our last visit reviewed. Allergies and medications reviewed and updated.  ROS:  Review of Systems   Social History   Tobacco Use  Smoking Status Current Every Day Smoker  . Packs/day: 1.00  . Years: 15.00  . Pack years: 15.00  . Types: Cigarettes  Smokeless Tobacco Never Used       Objective:     Wt Readings from Last 3 Encounters:  01/05/19 (!) 352 lb 12.8 oz (160 kg)  08/25/18 (!) 361 lb (163.7 kg)  07/23/18 (!) 370 lb (167.8 kg)     Exam deferred. Pt. Harboring due to COVID 19. Phone visit performed.   Assessment & Plan:   1. Acute suppurative otitis media without spontaneous rupture of ear drum, recurrence not specified, unspecified laterality   2. Sinobronchitis     Meds ordered this encounter  Medications  . amoxicillin-clavulanate (AUGMENTIN) 875-125 MG tablet    Sig: Take 1 tablet by mouth 2 (two) times daily. Take all of this medication    Dispense:  20 tablet    Refill:   0  . predniSONE (DELTASONE) 10 MG tablet    Sig: Take 5 daily for 3 days followed by 4,3,2 and 1 for 3 days each.    Dispense:  45 tablet    Refill:  0  . HYDROcodone-homatropine (HYCODAN) 5-1.5 MG/5ML syrup    Sig: Take 5 mLs by mouth every 6 (six) hours as needed for up to 5 days for cough.    Dispense:  100 mL    Refill:  0    No orders of the defined types were placed in this encounter.     Diagnoses and all orders for this visit:  Acute suppurative otitis media without spontaneous rupture of ear drum, recurrence not specified, unspecified laterality  Sinobronchitis  Other orders -     amoxicillin-clavulanate (AUGMENTIN) 875-125 MG tablet; Take 1 tablet by mouth 2 (two) times daily. Take all of this medication -     predniSONE (DELTASONE) 10 MG tablet; Take 5 daily for 3 days followed by 4,3,2 and 1 for 3 days each. -     HYDROcodone-homatropine (HYCODAN) 5-1.5 MG/5ML syrup; Take 5 mLs by mouth every 6 (six) hours as needed for up to 5 days for cough.  Virtual Visit via telephone Note  I discussed the limitations, risks, security and privacy concerns of performing an evaluation and management service by telephone and the availability of in person appointments. The patient was identified with two identifiers. Pt.expressed understanding and agreed to proceed. Pt. Is at home. Dr. Darlyn ReadStacks is in his office.  Follow Up Instructions:   I discussed the assessment and treatment plan with the patient. The patient was provided an opportunity to ask questions and all were answered. The patient agreed with the plan and demonstrated an understanding of the instructions.   The patient was advised to call back or seek an in-person evaluation if the symptoms worsen or if the condition fails to improve as anticipated.  Visit started: 4:20 Call ended:  4:28 Total minutes including chart review and phone contact time: 15   Follow up plan: Return if symptoms worsen or fail to improve.   Mechele ClaudeWarren Astrid Vides, MD [facility]

## 2019-02-27 NOTE — Progress Notes (Signed)
Greater than 5 minutes, yet less than 10 minutes of time have been spent researching, coordinating, and implementing care for this patient today.  Thank you for the details you included in the comment boxes. Those details are very helpful in determining the best course of treatment for you and help us to provide the best care.  

## 2019-05-24 ENCOUNTER — Ambulatory Visit (INDEPENDENT_AMBULATORY_CARE_PROVIDER_SITE_OTHER): Payer: 59 | Admitting: Family

## 2019-05-24 ENCOUNTER — Other Ambulatory Visit: Payer: Self-pay

## 2019-05-24 ENCOUNTER — Encounter: Payer: Self-pay | Admitting: Family

## 2019-05-24 DIAGNOSIS — R05 Cough: Secondary | ICD-10-CM

## 2019-05-24 DIAGNOSIS — R059 Cough, unspecified: Secondary | ICD-10-CM

## 2019-05-24 MED ORDER — HYDROCODONE-HOMATROPINE 5-1.5 MG/5ML PO SYRP: 5.0000 mL | ORAL_SOLUTION | Freq: Three times a day (TID) | ORAL | 0 refills | Status: DC | PRN

## 2019-05-24 MED ORDER — ALBUTEROL SULFATE HFA 108 (90 BASE) MCG/ACT IN AERS: 2.0000 | INHALATION_SPRAY | Freq: Four times a day (QID) | RESPIRATORY_TRACT | 0 refills | Status: DC | PRN

## 2019-05-24 MED ORDER — PREDNISONE 10 MG (21) PO TBPK: ORAL_TABLET | ORAL | 0 refills | Status: DC

## 2019-05-24 NOTE — Progress Notes (Signed)
Virtual Visit via telephone Note Due to COVID-19 pandemic this visit was conducted virtually. This visit type was conducted due to national recommendations for restrictions regarding the COVID-19 Pandemic (e.g. social distancing, sheltering in place) in an effort to limit this patient's exposure and mitigate transmission in our community. All issues noted in this document were discussed and addressed.  A physical exam was not performed with this format.  I connected with Casey Hoover on 05/24/19 at 12:14 pm by telephone and verified that I am speaking with the correct person using two identifiers. Casey PrimesJames Michael Hoover is currently located at driving  and no one is currently with her during visit. The provider, Jannifer Rodneyhristy Loanne Emery, FNP is located in their office at time of visit.  I discussed the limitations, risks, security and privacy concerns of performing an evaluation and management service by telephone and the availability of in person appointments. I also discussed with the patient that there may be a patient responsible charge related to this service. The patient expressed understanding and agreed to proceed.   History and Present Illness:  Sinusitis This is a new problem. The current episode started in the past 7 days. The problem has been gradually worsening since onset. There has been no fever. His pain is at a severity of 5/10. The pain is moderate. Associated symptoms include chills, congestion, coughing, headaches, a hoarse voice, sinus pressure and sneezing. Pertinent negatives include no shortness of breath. Ear pain: ear congestion. (Wheezing ) Past treatments include oral decongestants. The treatment provided mild relief.     Review of Systems  Constitutional: Positive for chills.  HENT: Positive for congestion, hoarse voice, sinus pressure and sneezing. Ear pain: ear congestion.   Respiratory: Positive for cough. Negative for shortness of breath.   Neurological: Positive for  headaches.  All other systems reviewed and are negative.    Observations/Objective: No SOB or distress noted   Assessment and Plan: 1. Cough Pt will do COVID testing  Self isolate until results return - Take meds as prescribed - Use a cool mist humidifier  -Use saline nose sprays frequently -Force fluids -For any cough or congestion  Use plain Mucinex- regular strength or max strength is fine -For fever or aces or pains- take tylenol or ibuprofen. -Throat lozenges if help Call the office if symptoms worsen or do not improve  - albuterol (VENTOLIN HFA) 108 (90 Base) MCG/ACT inhaler; Inhale 2 puffs into the lungs every 6 (six) hours as needed for wheezing or shortness of breath.  Dispense: 8 g; Refill: 0 - HYDROcodone-homatropine (HYCODAN) 5-1.5 MG/5ML syrup; Take 5 mLs by mouth every 8 (eight) hours as needed for cough.  Dispense: 120 mL; Refill: 0 - Novel Coronavirus, NAA ([facility]) - predniSONE (STERAPRED UNI-PAK 21 TAB) 10 MG (21) TBPK tablet; Use as directed  Dispense: 21 tablet; Refill: 0     I discussed the assessment and treatment plan with the patient. The patient was provided an opportunity to ask questions and all were answered. The patient agreed with the plan and demonstrated an understanding of the instructions.   The patient was advised to call back or seek an in-person evaluation if the symptoms worsen or if the condition fails to improve as anticipated.  The above assessment and management plan was discussed with the patient. The patient verbalized understanding of and has agreed to the management plan. Patient is aware to call the clinic if symptoms persist or worsen. Patient is aware when to return to the clinic  for a follow-up visit. Patient educated on when it is appropriate to go to the emergency department.   Time call ended:  12:28 pm  I provided 14 minutes of non-face-to-face time during this encounter.    Casey Dun, FNP

## 2019-05-25 ENCOUNTER — Other Ambulatory Visit: Payer: Self-pay | Admitting: Internal Medicine

## 2019-05-25 ENCOUNTER — Other Ambulatory Visit: Payer: Self-pay | Admitting: Family

## 2019-05-25 DIAGNOSIS — Z20822 Contact with and (suspected) exposure to covid-19: Secondary | ICD-10-CM

## 2019-05-25 DIAGNOSIS — R059 Cough, unspecified: Secondary | ICD-10-CM

## 2019-05-25 DIAGNOSIS — R05 Cough: Secondary | ICD-10-CM

## 2019-05-26 LAB — NOVEL CORONAVIRUS, NAA: SARS-CoV-2, NAA: NOT DETECTED

## 2019-05-27 ENCOUNTER — Ambulatory Visit (INDEPENDENT_AMBULATORY_CARE_PROVIDER_SITE_OTHER): Payer: 59 | Admitting: Family Medicine

## 2019-05-27 ENCOUNTER — Encounter: Payer: Self-pay | Admitting: Family Medicine

## 2019-05-27 DIAGNOSIS — J019 Acute sinusitis, unspecified: Secondary | ICD-10-CM

## 2019-05-27 MED ORDER — AMOXICILLIN-POT CLAVULANATE 875-125 MG PO TABS: 1.0000 | ORAL_TABLET | Freq: Two times a day (BID) | ORAL | 0 refills | Status: AC

## 2019-05-27 MED ORDER — HYDROCODONE-HOMATROPINE 5-1.5 MG/5ML PO SYRP: 5.0000 mL | ORAL_SOLUTION | ORAL | 0 refills | Status: DC | PRN

## 2019-05-27 NOTE — Progress Notes (Signed)
Virtual Visit via Telephone Note  I connected with Casey Hoover on 05/27/19 at 10:36 AM by telephone and verified that I am speaking with the correct person using two identifiers. Casey Hoover is currently located at home and nobody is currently with him during this visit. The provider, Loman Brooklyn, FNP is located in their home at time of visit.  I discussed the limitations, risks, security and privacy concerns of performing an evaluation and management service by telephone and the availability of in person appointments. I also discussed with the patient that there may be a patient responsible charge related to this service. The patient expressed understanding and agreed to proceed.  Subjective: PCP: Claretta Fraise, MD  Chief Complaint  Patient presents with  . Sinusitis   Patient complains of head congestion and ear pain/pressure. Additional symptoms include cough, head/chest congestion, headache, sneezing, facial pain/pressure and fever. Cough is productive of clear sputum but he reports it was yellow. Max temp 101.9 a week ago. Onset of symptoms was 1 week ago, gradually worsening since that time. He is drinking plenty of fluids. Evaluation to date: patient had a telephone visit 3 days ago and was treated with an Albuterol inhaler, Hycodan, and Prednisone. He tested negative for COVID-19 2 days ago. Treatment to date: cough suppressants and decongestants. He has a history of allergies. He does smoke.    ROS: Per HPI  Current Outpatient Medications:  .  ACCU-CHEK SOFTCLIX LANCETS lancets, USE TO CHECK SUGAR 4 TIMES DAILY, Disp: 100 each, Rfl: 0 .  albuterol (VENTOLIN HFA) 108 (90 Base) MCG/ACT inhaler, Inhale 2 puffs into the lungs every 6 (six) hours as needed for wheezing or shortness of breath., Disp: 8 g, Rfl: 0 .  allopurinol (ZYLOPRIM) 100 MG tablet, Take 1 tablet (100 mg total) by mouth daily., Disp: 90 tablet, Rfl: 1 .  amoxicillin-clavulanate (AUGMENTIN) 875-125  MG tablet, Take 1 tablet by mouth 2 (two) times daily for 7 days., Disp: 14 tablet, Rfl: 0 .  ANDROGEL PUMP 20.25 MG/ACT (1.62%) GEL, Apply 4 Pump topically daily., Disp: 150 g, Rfl: 5 .  Blood Glucose Monitoring Suppl (ACCU-CHEK AVIVA CONNECT) w/Device KIT, 1 kit by Does not apply route QID., Disp: 1 kit, Rfl: 0 .  colchicine 0.6 MG tablet, TAKE 2 TABLETS AT ONSET AND REPEAT 1 TABLET DOSE 1 HOUR LATER ONCE IN 24 HOURS FOR GOUT FLARE. (Patient not taking: Reported on 01/05/2019), Disp: 30 tablet, Rfl: 0 .  fexofenadine-pseudoephedrine (ALLEGRA-D 24) 180-240 MG 24 hr tablet, Take 1 tablet by mouth every evening. For allergy and congestion, Disp: 30 tablet, Rfl: 11 .  fluticasone (FLONASE) 50 MCG/ACT nasal spray, Place 2 sprays into both nostrils daily. (Patient not taking: Reported on 01/05/2019), Disp: 16 g, Rfl: 6 .  glucose blood (ACCU-CHEK AVIVA) test strip, Use as instructed, Disp: 100 each, Rfl: 12 .  [START ON 05/28/2019] HYDROcodone-homatropine (HYCODAN) 5-1.5 MG/5ML syrup, Take 5 mLs by mouth every 4 (four) hours as needed for cough., Disp: 120 mL, Rfl: 0 .  metFORMIN (GLUCOPHAGE-XR) 750 MG 24 hr tablet, Take 1 tablet (750 mg total) by mouth daily with breakfast., Disp: 90 tablet, Rfl: 1 .  predniSONE (STERAPRED UNI-PAK 21 TAB) 10 MG (21) TBPK tablet, Use as directed, Disp: 21 tablet, Rfl: 0  Allergies  Allergen Reactions  . Sulfa Antibiotics Rash   Past Medical History:  Diagnosis Date  . Gout   . HTN (hypertension)   . OSA (obstructive sleep apnea) 08/04/2014  Observations/Objective: A&O  No respiratory distress or wheezing audible over the phone Mood, judgement, and thought processes all WNL  Assessment and Plan: 1. Acute non-recurrent sinusitis, unspecified location - Education provided on sinusitis. Patient to continue prednisone and Albuterol PRN.  - amoxicillin-clavulanate (AUGMENTIN) 875-125 MG tablet; Take 1 tablet by mouth 2 (two) times daily for 7 days.  Dispense: 14  tablet; Refill: 0 - HYDROcodone-homatropine (HYCODAN) 5-1.5 MG/5ML syrup; Take 5 mLs by mouth every 4 (four) hours as needed for cough.  Dispense: 120 mL; Refill: 0  Follow Up Instructions:  I discussed the assessment and treatment plan with the patient. The patient was provided an opportunity to ask questions and all were answered. The patient agreed with the plan and demonstrated an understanding of the instructions.   The patient was advised to call back or seek an in-person evaluation if the symptoms worsen or if the condition fails to improve as anticipated.  The above assessment and management plan was discussed with the patient. The patient verbalized understanding of and has agreed to the management plan. Patient is aware to call the clinic if symptoms persist or worsen. Patient is aware when to return to the clinic for a follow-up visit. Patient educated on when it is appropriate to go to the emergency department.   Time call ended: 10:46 AM  I provided 10 minutes of non-face-to-face time during this encounter.  Hendricks Limes, MSN, APRN, FNP-C [facility] 05/27/19

## 2019-05-27 NOTE — Patient Instructions (Signed)

## 2019-06-03 ENCOUNTER — Other Ambulatory Visit: Payer: Self-pay | Admitting: *Deleted

## 2019-06-03 DIAGNOSIS — R059 Cough, unspecified: Secondary | ICD-10-CM

## 2019-06-03 DIAGNOSIS — R05 Cough: Secondary | ICD-10-CM

## 2019-06-03 MED ORDER — ALBUTEROL SULFATE HFA 108 (90 BASE) MCG/ACT IN AERS: 2.0000 | INHALATION_SPRAY | Freq: Four times a day (QID) | RESPIRATORY_TRACT | 0 refills | Status: DC | PRN

## 2019-07-19 ENCOUNTER — Encounter: Payer: Self-pay | Admitting: Family Medicine

## 2019-07-19 ENCOUNTER — Ambulatory Visit (INDEPENDENT_AMBULATORY_CARE_PROVIDER_SITE_OTHER): Payer: 59 | Admitting: Family Medicine

## 2019-07-19 ENCOUNTER — Ambulatory Visit: Payer: 59 | Admitting: Physician Assistant

## 2019-07-19 DIAGNOSIS — M1A0710 Idiopathic chronic gout, right ankle and foot, without tophus (tophi): Secondary | ICD-10-CM | POA: Diagnosis not present

## 2019-07-19 MED ORDER — COLCHICINE 0.6 MG PO TABS: ORAL_TABLET | ORAL | 0 refills | Status: DC

## 2019-07-19 NOTE — Progress Notes (Signed)
Virtual Visit via telephone Note Due to COVID-19 pandemic this visit was conducted virtually. This visit type was conducted due to national recommendations for restrictions regarding the COVID-19 Pandemic (e.g. social distancing, sheltering in place) in an effort to limit this patient's exposure and mitigate transmission in our community. All issues noted in this document were discussed and addressed.  A physical exam was not performed with this format.   I connected with Casey Hoover on 07/19/19 at 1505 by telephone and verified that I am speaking with the correct person using two identifiers. Casey Hoover is currently located at home and family is currently with them during visit. The provider, Monia Pouch, FNP is located in their office at time of visit.  I discussed the limitations, risks, security and privacy concerns of performing an evaluation and management service by telephone and the availability of in person appointments. I also discussed with the patient that there may be a patient responsible charge related to this service. The patient expressed understanding and agreed to proceed.  Subjective:  Patient ID: Casey Hoover, male    DOB: 1980-12-20, 38 y.o.   MRN: [id number]  Chief Complaint:  Gout   HPI: Casey Hoover is a 38 y.o. male presenting on 07/19/2019 for Gout   Pt reports right great toe pain, swelling, and redness. No known injury. States he has a history of gout and is on preventative therapy but does have flares frequently. States he is out of his colchicine. No other reported symptoms. Onset 3 days ago. Sharp, shooting pain, 8/10 at times. Nothing helps the pain. Any movement or weight bearing increases the pain.     Relevant past medical, surgical, family, and social history reviewed and updated as indicated.  Allergies and medications reviewed and updated.   Past Medical History:  Diagnosis Date  . Gout   . HTN (hypertension)   . OSA  (obstructive sleep apnea) 08/04/2014    Past Surgical History:  Procedure Laterality Date  . WISDOM TOOTH EXTRACTION  2010    Social History   Socioeconomic History  . Marital status: Single    Spouse name: Not on file  . Number of children: Not on file  . Years of education: Not on file  . Highest education level: Not on file  Occupational History  . Occupation: Designer, jewellery  . Financial resource strain: Not on file  . Food insecurity    Worry: Not on file    Inability: Not on file  . Transportation needs    Medical: Not on file    Non-medical: Not on file  Tobacco Use  . Smoking status: Current Every Day Smoker    Packs/day: 1.00    Years: 15.00    Pack years: 15.00    Types: Cigarettes  . Smokeless tobacco: Never Used  Substance and Sexual Activity  . Alcohol use: Yes    Comment: social  . Drug use: No  . Sexual activity: Not on file  Lifestyle  . Physical activity    Days per week: Not on file    Minutes per session: Not on file  . Stress: Not on file  Relationships  . Social Herbalist on phone: Not on file    Gets together: Not on file    Attends religious service: Not on file    Active member of club or organization: Not on file    Attends meetings of clubs or organizations: Not  on file    Relationship status: Not on file  . Intimate partner violence    Fear of current or ex partner: Not on file    Emotionally abused: Not on file    Physically abused: Not on file    Forced sexual activity: Not on file  Other Topics Concern  . Not on file  Social History Narrative  . Not on file    Outpatient Encounter Medications as of 07/19/2019  Medication Sig  . ACCU-CHEK SOFTCLIX LANCETS lancets USE TO CHECK SUGAR 4 TIMES DAILY  . albuterol (VENTOLIN HFA) 108 (90 Base) MCG/ACT inhaler Inhale 2 puffs into the lungs every 6 (six) hours as needed for wheezing or shortness of breath.  . allopurinol (ZYLOPRIM) 100 MG tablet Take 1 tablet  (100 mg total) by mouth daily.  . ANDROGEL PUMP 20.25 MG/ACT (1.62%) GEL Apply 4 Pump topically daily.  . Blood Glucose Monitoring Suppl (ACCU-CHEK AVIVA CONNECT) w/Device KIT 1 kit by Does not apply route QID.  Marland Kitchen colchicine 0.6 MG tablet TAKE 2 TABLETS AT ONSET AND REPEAT 1 TABLET DOSE 1 HOUR LATER ONCE IN 24 HOURS FOR GOUT FLARE.  . fexofenadine-pseudoephedrine (ALLEGRA-D 24) 180-240 MG 24 hr tablet Take 1 tablet by mouth every evening. For allergy and congestion  . fluticasone (FLONASE) 50 MCG/ACT nasal spray Place 2 sprays into both nostrils daily. (Patient not taking: Reported on 01/05/2019)  . glucose blood (ACCU-CHEK AVIVA) test strip Use as instructed  . HYDROcodone-homatropine (HYCODAN) 5-1.5 MG/5ML syrup Take 5 mLs by mouth every 4 (four) hours as needed for cough.  . metFORMIN (GLUCOPHAGE-XR) 750 MG 24 hr tablet Take 1 tablet (750 mg total) by mouth daily with breakfast.  . predniSONE (STERAPRED UNI-PAK 21 TAB) 10 MG (21) TBPK tablet Use as directed  . [DISCONTINUED] colchicine 0.6 MG tablet TAKE 2 TABLETS AT ONSET AND REPEAT 1 TABLET DOSE 1 HOUR LATER ONCE IN 24 HOURS FOR GOUT FLARE. (Patient not taking: Reported on 01/05/2019)   No facility-administered encounter medications on file as of 07/19/2019.     Allergies  Allergen Reactions  . Sulfa Antibiotics Rash    Review of Systems  Constitutional: Negative for activity change, appetite change, chills, diaphoresis, fatigue, fever and unexpected weight change.  HENT: Negative.   Eyes: Negative.  Negative for photophobia and visual disturbance.  Respiratory: Negative for cough, chest tightness and shortness of breath.   Cardiovascular: Negative for chest pain, palpitations and leg swelling.  Gastrointestinal: Negative for abdominal pain, blood in stool, constipation, diarrhea, nausea and vomiting.  Endocrine: Negative.   Genitourinary: Negative for decreased urine volume, difficulty urinating, dysuria, frequency and urgency.   Musculoskeletal: Positive for arthralgias and joint swelling. Negative for myalgias.  Skin: Positive for color change. Negative for pallor, rash and wound.  Allergic/Immunologic: Negative.   Neurological: Negative for dizziness, tremors, seizures, syncope, facial asymmetry, speech difficulty, weakness, light-headedness, numbness and headaches.  Hematological: Negative.   Psychiatric/Behavioral: Negative for confusion, hallucinations, sleep disturbance and suicidal ideas.  All other systems reviewed and are negative.        Observations/Objective: No vital signs or physical exam, this was a telephone or virtual health encounter.  Pt alert and oriented, answers all questions appropriately, and able to speak in full sentences.    Assessment and Plan: Daimion was seen today for gout.  Diagnoses and all orders for this visit:  Chronic idiopathic gout involving toe of right foot without tophus Right great toe pain with swelling and redness. History of gout,  no injuries. Has not been following a strict diet. Pt reports symptoms are same as previous gout flares. Will treat with colchicine. Pt aware of dosing. Pt aware to report any new or worsening symptoms.  -     colchicine 0.6 MG tablet; TAKE 2 TABLETS AT ONSET AND REPEAT 1 TABLET DOSE 1 HOUR LATER ONCE IN 24 HOURS FOR GOUT FLARE.     Follow Up Instructions: Return if symptoms worsen or fail to improve.    I discussed the assessment and treatment plan with the patient. The patient was provided an opportunity to ask questions and all were answered. The patient agreed with the plan and demonstrated an understanding of the instructions.   The patient was advised to call back or seek an in-person evaluation if the symptoms worsen or if the condition fails to improve as anticipated.  The above assessment and management plan was discussed with the patient. The patient verbalized understanding of and has agreed to the management plan. Patient  is aware to call the clinic if they develop any new symptoms or if symptoms persist or worsen. Patient is aware when to return to the clinic for a follow-up visit. Patient educated on when it is appropriate to go to the emergency department.    I provided 15 minutes of non-face-to-face time during this encounter. The call started at 1505. The call ended at 1520. The other time was used for coordination of care.    Monia Pouch, FNP-C [facility] [address] [place], [place] [address] [phone] 07/19/19

## 2019-07-30 ENCOUNTER — Other Ambulatory Visit: Payer: Self-pay | Admitting: Family Medicine

## 2019-08-18 ENCOUNTER — Other Ambulatory Visit: Payer: Self-pay | Admitting: Family Medicine

## 2019-08-24 ENCOUNTER — Other Ambulatory Visit: Payer: Self-pay | Admitting: Family Medicine

## 2019-08-24 MED ORDER — METFORMIN HCL ER 750 MG PO TB24: 750.0000 mg | ORAL_TABLET | Freq: Every day | ORAL | 0 refills | Status: DC

## 2019-08-24 NOTE — Telephone Encounter (Signed)
Stacks. NTBS 30 days given 07/30/19

## 2019-08-24 NOTE — Addendum Note (Signed)
Addended by: Ladean Raya on: 08/24/2019 10:18 AM   Modules accepted: Orders

## 2019-09-03 ENCOUNTER — Other Ambulatory Visit: Payer: Self-pay

## 2019-09-06 ENCOUNTER — Ambulatory Visit: Payer: 59 | Admitting: Family Medicine

## 2019-09-06 ENCOUNTER — Encounter: Payer: Self-pay | Admitting: Family Medicine

## 2020-02-23 ENCOUNTER — Encounter: Payer: Self-pay | Admitting: Family Medicine

## 2020-02-23 ENCOUNTER — Ambulatory Visit (INDEPENDENT_AMBULATORY_CARE_PROVIDER_SITE_OTHER): Payer: BC Managed Care – PPO | Admitting: Family Medicine

## 2020-02-23 DIAGNOSIS — J019 Acute sinusitis, unspecified: Secondary | ICD-10-CM

## 2020-02-23 MED ORDER — ALBUTEROL SULFATE HFA 108 (90 BASE) MCG/ACT IN AERS: 2.0000 | INHALATION_SPRAY | Freq: Four times a day (QID) | RESPIRATORY_TRACT | 0 refills | Status: DC | PRN

## 2020-02-23 MED ORDER — PREDNISONE 10 MG (21) PO TBPK: ORAL_TABLET | ORAL | 0 refills | Status: DC

## 2020-02-23 MED ORDER — AMOXICILLIN-POT CLAVULANATE 875-125 MG PO TABS: 1.0000 | ORAL_TABLET | Freq: Two times a day (BID) | ORAL | 0 refills | Status: DC

## 2020-02-23 MED ORDER — FLUTICASONE PROPIONATE 50 MCG/ACT NA SUSP: 2.0000 | Freq: Every day | NASAL | 6 refills | Status: DC

## 2020-02-23 NOTE — Progress Notes (Signed)
Virtual Visit via Telephone Note  I connected with Casey Hoover on 02/23/20 at 1:23 PM by telephone and verified that I am speaking with the correct person using two identifiers. Casey Hoover is currently located at home and his wife is currently with him during this visit. The provider, Loman Brooklyn, FNP is located in their office at time of visit.  I discussed the limitations, risks, security and privacy concerns of performing an evaluation and management service by telephone and the availability of in person appointments. I also discussed with the patient that there may be a patient responsible charge related to this service. The patient expressed understanding and agreed to proceed.  Subjective: PCP: Claretta Fraise, MD  Chief Complaint  Patient presents with  . URI   Patient complains of cough, head congestion, headache, sneezing, ear pain/pressure, facial pain/pressure, postnasal drainage and shortness of breath. Onset of symptoms was 1 week ago, gradually worsening since that time. He is drinking plenty of fluids. Evaluation to date: none. Treatment to date: antihistamines and Tylenol Sinus. He does smoke.    ROS: Per HPI  Current Outpatient Medications:  .  ACCU-CHEK SOFTCLIX LANCETS lancets, USE TO CHECK SUGAR 4 TIMES DAILY, Disp: 100 each, Rfl: 0 .  albuterol (VENTOLIN HFA) 108 (90 Base) MCG/ACT inhaler, Inhale 2 puffs into the lungs every 6 (six) hours as needed for wheezing or shortness of breath., Disp: 8 g, Rfl: 0 .  allopurinol (ZYLOPRIM) 100 MG tablet, TAKE 1 TABLET BY MOUTH EVERY DAY, Disp: 90 tablet, Rfl: 1 .  ANDROGEL PUMP 20.25 MG/ACT (1.62%) GEL, Apply 4 Pump topically daily., Disp: 150 g, Rfl: 5 .  Blood Glucose Monitoring Suppl (ACCU-CHEK AVIVA CONNECT) w/Device KIT, 1 kit by Does not apply route QID., Disp: 1 kit, Rfl: 0 .  colchicine 0.6 MG tablet, TAKE 2 TABLETS AT ONSET AND REPEAT 1 TABLET DOSE 1 HOUR LATER ONCE IN 24 HOURS FOR GOUT FLARE., Disp:  30 tablet, Rfl: 0 .  fexofenadine-pseudoephedrine (ALLEGRA-D 24) 180-240 MG 24 hr tablet, Take 1 tablet by mouth every evening. For allergy and congestion, Disp: 30 tablet, Rfl: 11 .  fluticasone (FLONASE) 50 MCG/ACT nasal spray, Place 2 sprays into both nostrils daily. (Patient not taking: Reported on 01/05/2019), Disp: 16 g, Rfl: 6 .  glucose blood (ACCU-CHEK AVIVA) test strip, Use as instructed, Disp: 100 each, Rfl: 12 .  HYDROcodone-homatropine (HYCODAN) 5-1.5 MG/5ML syrup, Take 5 mLs by mouth every 4 (four) hours as needed for cough., Disp: 120 mL, Rfl: 0 .  metFORMIN (GLUCOPHAGE-XR) 750 MG 24 hr tablet, Take 1 tablet (750 mg total) by mouth daily with breakfast. (Needs to be seen before next refill), Disp: 30 tablet, Rfl: 0 .  predniSONE (STERAPRED UNI-PAK 21 TAB) 10 MG (21) TBPK tablet, Use as directed, Disp: 21 tablet, Rfl: 0  Allergies  Allergen Reactions  . Sulfa Antibiotics Rash   Past Medical History:  Diagnosis Date  . Gout   . HTN (hypertension)   . OSA (obstructive sleep apnea) 08/04/2014    Observations/Objective: A&O  No respiratory distress or wheezing audible over the phone Mood, judgement, and thought processes all WNL  Assessment and Plan: 1. Acute non-recurrent sinusitis, unspecified location - Discussed symptom management.  - amoxicillin-clavulanate (AUGMENTIN) 875-125 MG tablet; Take 1 tablet by mouth 2 (two) times daily.  Dispense: 20 tablet; Refill: 0 - predniSONE (STERAPRED UNI-PAK 21 TAB) 10 MG (21) TBPK tablet; As directed x 6 days  Dispense: 21 tablet; Refill: 0 -  albuterol (VENTOLIN HFA) 108 (90 Base) MCG/ACT inhaler; Inhale 2 puffs into the lungs every 6 (six) hours as needed for wheezing or shortness of breath.  Dispense: 18 g; Refill: 0 - fluticasone (FLONASE) 50 MCG/ACT nasal spray; Place 2 sprays into both nostrils daily.  Dispense: 16 g; Refill: 6   Follow Up Instructions:  I discussed the assessment and treatment plan with the patient. The  patient was provided an opportunity to ask questions and all were answered. The patient agreed with the plan and demonstrated an understanding of the instructions.   The patient was advised to call back or seek an in-person evaluation if the symptoms worsen or if the condition fails to improve as anticipated.  The above assessment and management plan was discussed with the patient. The patient verbalized understanding of and has agreed to the management plan. Patient is aware to call the clinic if symptoms persist or worsen. Patient is aware when to return to the clinic for a follow-up visit. Patient educated on when it is appropriate to go to the emergency department.   Time call ended: 1:27 PM  I provided 6 minutes of non-face-to-face time during this encounter.  Hendricks Limes, MSN, APRN, FNP-C [facility] 02/23/20

## 2020-04-16 ENCOUNTER — Encounter (HOSPITAL_COMMUNITY): Payer: Self-pay | Admitting: Emergency Medicine

## 2020-04-16 ENCOUNTER — Emergency Department (HOSPITAL_COMMUNITY)
Admission: EM | Admit: 2020-04-16 | Discharge: 2020-04-17 | Disposition: A | Discharge status: Home / Self Care | Payer: BC Managed Care – PPO | Attending: Emergency Medicine | Admitting: Emergency Medicine

## 2020-04-16 ENCOUNTER — Other Ambulatory Visit: Payer: Self-pay

## 2020-04-16 ENCOUNTER — Emergency Department (HOSPITAL_COMMUNITY): Payer: BC Managed Care – PPO

## 2020-04-16 DIAGNOSIS — E119 Type 2 diabetes mellitus without complications: Secondary | ICD-10-CM | POA: Diagnosis not present

## 2020-04-16 DIAGNOSIS — I10 Essential (primary) hypertension: Secondary | ICD-10-CM | POA: Insufficient documentation

## 2020-04-16 DIAGNOSIS — K529 Noninfective gastroenteritis and colitis, unspecified: Secondary | ICD-10-CM | POA: Diagnosis not present

## 2020-04-16 DIAGNOSIS — Z79899 Other long term (current) drug therapy: Secondary | ICD-10-CM | POA: Diagnosis not present

## 2020-04-16 DIAGNOSIS — R1033 Periumbilical pain: Secondary | ICD-10-CM | POA: Insufficient documentation

## 2020-04-16 DIAGNOSIS — F17210 Nicotine dependence, cigarettes, uncomplicated: Secondary | ICD-10-CM | POA: Insufficient documentation

## 2020-04-16 DIAGNOSIS — R509 Fever, unspecified: Secondary | ICD-10-CM | POA: Diagnosis present

## 2020-04-16 HISTORY — DX: Type 2 diabetes mellitus without complications: E11.9

## 2020-04-16 LAB — COMPREHENSIVE METABOLIC PANEL
ALT: 57 U/L — ABNORMAL HIGH (ref 0–44)
AST: 40 U/L (ref 15–41)
Albumin: 4.1 g/dL (ref 3.5–5.0)
Alkaline Phosphatase: 75 U/L (ref 38–126)
Anion gap: 12 (ref 5–15)
BUN: 9 mg/dL (ref 6–20)
CO2: 22 mmol/L (ref 22–32)
Calcium: 9.5 mg/dL (ref 8.9–10.3)
Chloride: 101 mmol/L (ref 98–111)
Creatinine, Ser: 1.03 mg/dL (ref 0.61–1.24)
GFR calc Af Amer: >60 mL/min (ref >60)
GFR calc non Af Amer: >60 mL/min (ref >60)
Glucose, Bld: 226 mg/dL — ABNORMAL HIGH (ref 70–99)
Potassium: 4.3 mmol/L (ref 3.5–5.1)
Sodium: 135 mmol/L (ref 135–145)
Total Bilirubin: 0.8 mg/dL (ref 0.3–1.2)
Total Protein: 7.5 g/dL (ref 6.5–8.1)

## 2020-04-16 LAB — LIPASE, BLOOD: Lipase: 26 U/L (ref 11–51)

## 2020-04-16 LAB — CBC
HCT: 47.1 % (ref 39.0–52.0)
Hemoglobin: 15.8 g/dL (ref 13.0–17.0)
MCH: 30.9 pg (ref 26.0–34.0)
MCHC: 33.5 g/dL (ref 30.0–36.0)
MCV: 92.2 fL (ref 80.0–100.0)
Platelets: 204 10*3/uL (ref 150–400)
RBC: 5.11 MIL/uL (ref 4.22–5.81)
RDW: 12.9 % (ref 11.5–15.5)
WBC: 11.8 10*3/uL — ABNORMAL HIGH (ref 4.0–10.5)
nRBC: 0 % (ref 0.0–0.2)

## 2020-04-16 LAB — LACTIC ACID, PLASMA
Lactic Acid, Venous: 2.1 mmol/L (ref 0.5–1.9)
Lactic Acid, Venous: 1.4 mmol/L (ref 0.5–1.9)

## 2020-04-16 MED ORDER — HYDROMORPHONE HCL 1 MG/ML IJ SOLN
1.0000 mg | Freq: Once | INTRAMUSCULAR | Status: AC
  Administered 2020-04-16: 1 mg via INTRAVENOUS
  Filled 2020-04-16: qty 1

## 2020-04-16 MED ORDER — SODIUM CHLORIDE 0.9 % IV SOLN: INTRAVENOUS | Status: DC

## 2020-04-16 MED ORDER — ONDANSETRON HCL 4 MG/2ML IJ SOLN
4.0000 mg | Freq: Once | INTRAMUSCULAR | Status: AC
  Administered 2020-04-16: 4 mg via INTRAVENOUS
  Filled 2020-04-16: qty 2

## 2020-04-16 NOTE — ED Notes (Signed)
Date and time results received: 04/16/20 8:52 PM  Test: Lactic Acid Critical Value: 2.1  Name of Provider Notified: Dr. Deretha Emory  Orders Received? Or Actions Taken?: n/a

## 2020-04-16 NOTE — ED Triage Notes (Signed)
Pt here c/o fever, vomiting, and abd pain since Saturday morning. Pt states that he had a tick bite about 2 wks ago as well.

## 2020-04-17 ENCOUNTER — Emergency Department (HOSPITAL_COMMUNITY): Payer: BC Managed Care – PPO

## 2020-04-17 LAB — URINALYSIS, ROUTINE W REFLEX MICROSCOPIC
Bacteria, UA: NONE SEEN
Bilirubin Urine: NEGATIVE
Glucose, UA: NEGATIVE mg/dL
Ketones, ur: NEGATIVE mg/dL
Leukocytes,Ua: NEGATIVE
Nitrite: NEGATIVE
Protein, ur: 100 mg/dL — AB
Specific Gravity, Urine: >1.046 — ABNORMAL HIGH (ref 1.005–1.030)
pH: 5 (ref 5.0–8.0)

## 2020-04-17 MED ORDER — CIPROFLOXACIN IN D5W 400 MG/200ML IV SOLN
400.0000 mg | Freq: Once | INTRAVENOUS | Status: AC
  Administered 2020-04-17: 400 mg via INTRAVENOUS
  Filled 2020-04-17: qty 200

## 2020-04-17 MED ORDER — ONDANSETRON 4 MG PO TBDP
4.0000 mg | ORAL_TABLET | Freq: Three times a day (TID) | ORAL | 0 refills | Status: DC | PRN
Start: 2020-04-17 — End: 2020-11-14

## 2020-04-17 MED ORDER — CIPROFLOXACIN HCL 500 MG PO TABS
500.0000 mg | ORAL_TABLET | Freq: Two times a day (BID) | ORAL | 0 refills | Status: DC
Start: 2020-04-17 — End: 2020-11-14

## 2020-04-17 MED ORDER — HYDROMORPHONE HCL 1 MG/ML IJ SOLN
1.0000 mg | Freq: Once | INTRAMUSCULAR | Status: AC
  Administered 2020-04-17: 1 mg via INTRAVENOUS
  Filled 2020-04-17: qty 1

## 2020-04-17 MED ORDER — HYDROCODONE-ACETAMINOPHEN 5-325 MG PO TABS
1.0000 | ORAL_TABLET | Freq: Once | ORAL | Status: AC
  Administered 2020-04-17: 1 via ORAL
  Filled 2020-04-17: qty 1

## 2020-04-17 MED ORDER — IOHEXOL 300 MG/ML  SOLN
100.0000 mL | Freq: Once | INTRAMUSCULAR | Status: AC | PRN
  Administered 2020-04-17: 100 mL via INTRAVENOUS

## 2020-04-17 MED ORDER — METRONIDAZOLE IN NACL 5-0.79 MG/ML-% IV SOLN
500.0000 mg | Freq: Once | INTRAVENOUS | Status: AC
  Administered 2020-04-17: 500 mg via INTRAVENOUS
  Filled 2020-04-17: qty 100

## 2020-04-17 MED ORDER — HYDROCODONE-ACETAMINOPHEN 5-325 MG PO TABS: 1.0000 | ORAL_TABLET | Freq: Four times a day (QID) | ORAL | 0 refills | Status: DC | PRN

## 2020-04-17 MED ORDER — METRONIDAZOLE 500 MG PO TABS
500.0000 mg | ORAL_TABLET | Freq: Two times a day (BID) | ORAL | 0 refills | Status: DC
Start: 2020-04-17 — End: 2020-11-14

## 2020-04-17 NOTE — ED Provider Notes (Signed)
[facility] EMERGENCY DEPARTMENT Provider Note   CSN: [id number] Arrival date & time: 04/16/20  1943     History Chief Complaint  Patient presents with  . Fever  . Abdominal Pain    Casey Hoover is a 39 y.o. male.  Patient with a complaint of fever vomiting and periumbilical abdominal pain since Saturday morning.  Patient feels as if it is like cramps.  Patient states that he had tick bite 2 weeks ago.  No documented fever here.  Temp was 99.5.  Repeat temperature was 98.5.  Little tachycardic with heart rate 1 13-1 09.  Initial blood pressure was 167 systolic and is now 137 systolic.  Oxygen saturations were fine 97%.  Patient denies any rash.  Patient tells me that he has a history of an umbilical hernia.  Only other past surgical history is gallbladder is been removed.  Has passed no results never hypertensions sleep apnea and diabetes without complications.  Patient denies any chest pain or any shortness of breath.        Past Medical History:  Diagnosis Date  . Diabetes mellitus without complication (HCC)   . Gout   . HTN (hypertension)   . OSA (obstructive sleep apnea) 08/04/2014    Patient Active Problem List   Diagnosis Date Noted  . Low testosterone 12/28/2015  . Tobacco abuse 12/26/2015  . Morbid obesity (HCC) 07/11/2015  . Fatigue 07/11/2015  . OSA (obstructive sleep apnea) 08/04/2014  . Gout 03/07/2014    Past Surgical History:  Procedure Laterality Date  . CHOLECYSTECTOMY    . WISDOM TOOTH EXTRACTION  2010       Family History  Problem Relation Age of Onset  . Hypertension Father   . Heart attack Father   . Diabetes Mother   . Hypertension Other        multiple family members  . Diabetes Other        multiple family members  . Heart Problems Other        multiple family members    Social History   Tobacco Use  . Smoking status: Current Every Day Smoker    Packs/day: 1.00    Years: 15.00    Pack years: 15.00    Types: Cigarettes    . Smokeless tobacco: Never Used  Vaping Use  . Vaping Use: Never used  Substance Use Topics  . Alcohol use: Yes    Comment: social  . Drug use: No    Home Medications Prior to Admission medications   Medication Sig Start Date End Date Taking? Authorizing Provider  ACCU-CHEK SOFTCLIX LANCETS lancets USE TO CHECK SUGAR 4 TIMES DAILY 10/02/18   Mechele Claude, MD  albuterol (VENTOLIN HFA) 108 (90 Base) MCG/ACT inhaler Inhale 2 puffs into the lungs every 6 (six) hours as needed for wheezing or shortness of breath. 02/23/20   Gwenlyn Fudge, FNP  allopurinol (ZYLOPRIM) 100 MG tablet TAKE 1 TABLET BY MOUTH EVERY DAY 08/18/19   Mechele Claude, MD  amoxicillin-clavulanate (AUGMENTIN) 875-125 MG tablet Take 1 tablet by mouth 2 (two) times daily. 02/23/20   Gwenlyn Fudge, FNP  ANDROGEL PUMP 20.25 MG/ACT (1.62%) GEL Apply 4 Pump topically daily. 01/13/19   Mechele Claude, MD  Blood Glucose Monitoring Suppl (ACCU-CHEK AVIVA CONNECT) w/Device KIT 1 kit by Does not apply route QID. 08/25/18   Mechele Claude, MD  colchicine 0.6 MG tablet TAKE 2 TABLETS AT ONSET AND REPEAT 1 TABLET DOSE 1 HOUR LATER ONCE IN 24  HOURS FOR GOUT FLARE. 07/19/19   Sonny Masters, FNP  fexofenadine-pseudoephedrine (ALLEGRA-D 24) 180-240 MG 24 hr tablet Take 1 tablet by mouth every evening. For allergy and congestion 01/13/19   Mechele Claude, MD  fluticasone [facility]) 50 MCG/ACT nasal spray Place 2 sprays into both nostrils daily. 02/23/20   Gwenlyn Fudge, FNP  glucose blood (ACCU-CHEK AVIVA) test strip Use as instructed 09/24/18   Mechele Claude, MD  HYDROcodone-homatropine [facility]) 5-1.5 MG/5ML syrup Take 5 mLs by mouth every 4 (four) hours as needed for cough. 05/28/19   Gwenlyn Fudge, FNP  metFORMIN (GLUCOPHAGE-XR) 750 MG 24 hr tablet Take 1 tablet (750 mg total) by mouth daily with breakfast. (Needs to be seen before next refill) 08/24/19   Mechele Claude, MD  predniSONE (STERAPRED UNI-PAK 21 TAB) 10 MG (21) TBPK tablet As  directed x 6 days 02/23/20   Gwenlyn Fudge, FNP    Allergies    Sulfa antibiotics  Review of Systems   Review of Systems  Constitutional: Negative for chills and fever.  HENT: Negative for congestion, rhinorrhea and sore throat.   Eyes: Negative for visual disturbance.  Respiratory: Negative for cough and shortness of breath.   Cardiovascular: Negative for chest pain and leg swelling.  Gastrointestinal: Positive for abdominal pain, nausea and vomiting. Negative for diarrhea.  Genitourinary: Negative for dysuria.  Musculoskeletal: Negative for back pain and neck pain.  Skin: Negative for rash.  Neurological: Negative for dizziness, light-headedness and headaches.  Hematological: Does not bruise/bleed easily.  Psychiatric/Behavioral: Negative for confusion.    Physical Exam Updated Vital Signs BP (!) 146/73 (BP Location: Right Arm)   Pulse 95   Temp 98.4 F (36.9 C) (Oral)   Resp 17   Ht 1.854 m (6\' 1" )   Wt (!) 158.3 kg   SpO2 93%   BMI 46.04 kg/m   Physical Exam Vitals and nursing note reviewed.  Constitutional:      Appearance: Normal appearance. He is well-developed.  HENT:     Head: Normocephalic and atraumatic.  Eyes:     Extraocular Movements: Extraocular movements intact.     Conjunctiva/sclera: Conjunctivae normal.     Pupils: Pupils are equal, round, and reactive to light.  Cardiovascular:     Rate and Rhythm: Regular rhythm. Tachycardia present.     Heart sounds: No murmur heard.   Pulmonary:     Effort: Pulmonary effort is normal. No respiratory distress.     Breath sounds: Normal breath sounds.  Abdominal:     General: There is distension.     Palpations: Abdomen is soft. There is no mass.     Tenderness: There is no abdominal tenderness.     Hernia: No hernia is present.  Musculoskeletal:        General: Normal range of motion.     Cervical back: Normal range of motion and neck supple.  Skin:    General: Skin is warm and dry.     Capillary  Refill: Capillary refill takes less than 2 seconds.  Neurological:     General: No focal deficit present.     Mental Status: He is alert and oriented to person, place, and time.     Cranial Nerves: No cranial nerve deficit.     Sensory: No sensory deficit.     Motor: No weakness.     Coordination: Coordination normal.     ED Results / Procedures / Treatments   Labs (all labs ordered are listed, but only  abnormal results are displayed) Labs Reviewed  COMPREHENSIVE METABOLIC PANEL - Abnormal; Notable for the following components:      Result Value   Glucose, Bld 226 (*)    ALT 57 (*)    All other components within normal limits  CBC - Abnormal; Notable for the following components:   WBC 11.8 (*)    All other components within normal limits  LACTIC ACID, PLASMA - Abnormal; Notable for the following components:   Lactic Acid, Venous 2.1 (*)    All other components within normal limits  LIPASE, BLOOD  LACTIC ACID, PLASMA  URINALYSIS, ROUTINE W REFLEX MICROSCOPIC    EKG None  Radiology No results found.  Procedures Procedures (including critical care time)  Medications Ordered in ED Medications  0.9 %  sodium chloride infusion ( Intravenous New Bag/Given 04/16/20 2349)  ondansetron (ZOFRAN) injection 4 mg (4 mg Intravenous Given 04/16/20 2351)  HYDROmorphone (DILAUDID) injection 1 mg (1 mg Intravenous Given 04/16/20 2351)    ED Course  I have reviewed the triage vital signs and the nursing notes.  Pertinent labs & imaging results that were available during my care of the patient were reviewed by me and considered in my medical decision making (see chart for details).    MDM Rules/Calculators/A&P                          CT scan of the abdomen will be important to evaluate this abdominal pain.  Surgically with a history of the umbilical hernia.  Clinically cannot tell is any evidence of hernia.  Patient initially had a lactic acid that was elevated 2.1.  But repeat  lactic acid here tonight is now down to 1.4.  Blood sugar elevated some at 226 but no evidence of any significant acidosis.  Mild leukocytosis.  No evidence of rash.  No significant fever here.  Doubt this is [facility] spotted fever there was a history of the tick bite.  Doubt that it is Lyme's disease clinically.  Think the biggest concern is what is causing the abdominal pain.  In addition patient's sodium is not particularly low and liver function test without significant abnormalities.  CT scan is still pending.    Final Clinical Impression(s) / ED Diagnoses Final diagnoses:  Periumbilical abdominal pain    Rx / DC Orders ED Discharge Orders    None       Fredia Sorrow, MD 04/17/20 [phone]

## 2020-04-17 NOTE — ED Notes (Signed)
Pt transferred to radiology.

## 2020-04-17 NOTE — Discharge Instructions (Signed)
As we discussed, your CT scan shows some inflammation of the intestine called the ileum.  There is no evidence of abscess or perforation. You should take the antibiotics as prescribed and follow-up with your primary doctor as well as the gastroenterologist.  Return to the ED with worsening pain, fever, vomiting, any other concerns.

## 2020-04-17 NOTE — ED Provider Notes (Signed)
Care assumed from Dr. Deretha Emory.  Patient with abdominal pain, vomiting, diarrhea for the past 2 days.  Temperature 99.5.  Awaiting CT scan to evaluate.  Labs with mild lactate elevation and leukocytosis.  This improved with hydration.   CT scan shows evidence of enteritis without abscess or perforation.  Does not involve terminal ileum. Shows umbilical hernia containing fat only and omental vessels. No bowel involvement.   Patient given IV antibiotics.  No further vomiting. Patient with no family history of inflammatory bowel disease.  Does not know of any history of ulcerative colitis or Crohn's disease.  No recent black or bloody stools.  He is tolerating p.o. and his pain is controlled.  He does not want to be admitted.  He states he can take antibiotics at home.  We will give antibiotics for home use, pain control, gastroenterology follow-up.  Return to the ED with worsening pain, fever, vomiting, any other concerns.  BP (!) 153/103 (BP Location: Left Arm)   Pulse 99   Temp 98.4 F (36.9 C) (Oral)   Resp 17   Ht 6\' 1"  (1.854 m)   Wt (!) 158.3 kg   SpO2 95%   BMI 46.04 kg/m     , MD 04/17/20 [phone]

## 2020-11-14 ENCOUNTER — Ambulatory Visit (INDEPENDENT_AMBULATORY_CARE_PROVIDER_SITE_OTHER): Payer: BC Managed Care – PPO | Admitting: Family

## 2020-11-14 ENCOUNTER — Encounter: Payer: Self-pay | Admitting: Family

## 2020-11-14 DIAGNOSIS — M1A9XX0 Chronic gout, unspecified, without tophus (tophi): Secondary | ICD-10-CM | POA: Diagnosis not present

## 2020-11-14 DIAGNOSIS — J019 Acute sinusitis, unspecified: Secondary | ICD-10-CM | POA: Diagnosis not present

## 2020-11-14 DIAGNOSIS — F17200 Nicotine dependence, unspecified, uncomplicated: Secondary | ICD-10-CM

## 2020-11-14 MED ORDER — PREDNISONE 10 MG (21) PO TBPK: ORAL_TABLET | ORAL | 0 refills | Status: DC

## 2020-11-14 MED ORDER — AMOXICILLIN-POT CLAVULANATE 875-125 MG PO TABS: 1.0000 | ORAL_TABLET | Freq: Two times a day (BID) | ORAL | 0 refills | Status: DC

## 2020-11-14 MED ORDER — ALLOPURINOL 100 MG PO TABS: 100.0000 mg | ORAL_TABLET | Freq: Every day | ORAL | 0 refills | Status: DC

## 2020-11-14 NOTE — Progress Notes (Signed)
Virtual Visit via telephone Note Due to COVID-19 pandemic this visit was conducted virtually. This visit type was conducted due to national recommendations for restrictions regarding the COVID-19 Pandemic (e.g. social distancing, sheltering in place) in an effort to limit this patient's exposure and mitigate transmission in our community. All issues noted in this document were discussed and addressed.  A physical exam was not performed with this format.  I connected with Casey Hoover on 11/14/20 at 2:09 pm  by telephone and verified that I am speaking with the correct person using two identifiers. Casey Hoover is currently located at home and no one is currently with him  during visit. The provider, Jannifer Rodney, FNP is located in their office at time of visit.  I discussed the limitations, risks, security and privacy concerns of performing an evaluation and management service by telephone and the availability of in person appointments. I also discussed with the patient that there may be a patient responsible charge related to this service. The patient expressed understanding and agreed to proceed.   History and Present Illness:   Pt calls the office today with sinus problems. He reports he did a COVID test at home and it was negative.   He is also requesting a refill of his allopurinol. He reports his last gout flare up was "awhile ago". Sinusitis This is a new problem. The current episode started in the past 7 days. The problem is unchanged. There has been no fever. His pain is at a severity of 4/10. The pain is moderate. Associated symptoms include congestion, coughing, ear pain, headaches, shortness of breath, sinus pressure and sneezing. Pertinent negatives include no chills, hoarse voice or sore throat. Past treatments include oral decongestants. The treatment provided mild relief.      Review of Systems  Constitutional: Negative for chills.  HENT: Positive for congestion,  ear pain, sinus pressure and sneezing. Negative for hoarse voice and sore throat.   Respiratory: Positive for cough and shortness of breath.   Neurological: Positive for headaches.     Observations/Objective: No SOB Or distress noted   Assessment and Plan: 1. Acute sinusitis, recurrence not specified, unspecified location - Take meds as prescribed - Use a cool mist humidifier  -Use saline nose sprays frequently -Force fluids -For any cough or congestion  Use plain Mucinex- regular strength or max strength is fine -For fever or aces or pains- take tylenol or ibuprofen. -Throat lozenges if help -Call if symptoms worsen or do not improve Pt will repeat COVID test at home to make sure it is negative  - predniSONE (STERAPRED UNI-PAK 21 TAB) 10 MG (21) TBPK tablet; Use as directed  Dispense: 21 tablet; Refill: 0 - amoxicillin-clavulanate (AUGMENTIN) 875-125 MG tablet; Take 1 tablet by mouth 2 (two) times daily.  Dispense: 14 tablet; Refill: 0  2. Current smoker  3. Morbid obesity (HCC)  4. Chronic gout without tophus, unspecified cause, unspecified site - allopurinol (ZYLOPRIM) 100 MG tablet; Take 1 tablet (100 mg total) by mouth daily.  Dispense: 90 tablet; Refill: 0  PT will schedule chronic follow up in month.    I discussed the assessment and treatment plan with the patient. The patient was provided an opportunity to ask questions and all were answered. The patient agreed with the plan and demonstrated an understanding of the instructions.   The patient was advised to call back or seek an in-person evaluation if the symptoms worsen or if the condition fails to improve as  anticipated.  The above assessment and management plan was discussed with the patient. The patient verbalized understanding of and has agreed to the management plan. Patient is aware to call the clinic if symptoms persist or worsen. Patient is aware when to return to the clinic for a follow-up visit. Patient  educated on when it is appropriate to go to the emergency department.   Time call ended:  2:20 pm   I provided 11 minutes of non-face-to-face time during this encounter.    Jannifer Rodney, FNP

## 2020-12-26 ENCOUNTER — Encounter: Payer: Self-pay | Admitting: Family Medicine

## 2020-12-26 ENCOUNTER — Other Ambulatory Visit: Payer: Self-pay

## 2020-12-26 ENCOUNTER — Telehealth: Payer: Self-pay | Admitting: *Deleted

## 2020-12-26 ENCOUNTER — Ambulatory Visit: Payer: BC Managed Care – PPO | Admitting: Family Medicine

## 2020-12-26 VITALS — BP 138/89 | HR 89 | Temp 96.6°F | Ht 73.0 in | Wt 353.6 lb

## 2020-12-26 DIAGNOSIS — I10 Essential (primary) hypertension: Secondary | ICD-10-CM

## 2020-12-26 DIAGNOSIS — E1165 Type 2 diabetes mellitus with hyperglycemia: Secondary | ICD-10-CM

## 2020-12-26 DIAGNOSIS — M1A09X0 Idiopathic chronic gout, multiple sites, without tophus (tophi): Secondary | ICD-10-CM | POA: Diagnosis not present

## 2020-12-26 DIAGNOSIS — F419 Anxiety disorder, unspecified: Secondary | ICD-10-CM

## 2020-12-26 DIAGNOSIS — F331 Major depressive disorder, recurrent, moderate: Secondary | ICD-10-CM

## 2020-12-26 DIAGNOSIS — M1A9XX0 Chronic gout, unspecified, without tophus (tophi): Secondary | ICD-10-CM

## 2020-12-26 DIAGNOSIS — R7989 Other specified abnormal findings of blood chemistry: Secondary | ICD-10-CM

## 2020-12-26 LAB — CBC WITH DIFFERENTIAL/PLATELET
Basophils Absolute: 0.1 10*3/uL (ref 0.0–0.2)
Basos: 1 %
EOS (ABSOLUTE): 0.2 10*3/uL (ref 0.0–0.4)
Eos: 2 %
Hematocrit: 49 % (ref 37.5–51.0)
Hemoglobin: 16.9 g/dL (ref 13.0–17.7)
Immature Grans (Abs): 0.1 10*3/uL (ref 0.0–0.1)
Immature Granulocytes: 1 %
Lymphocytes Absolute: 1.9 10*3/uL (ref 0.7–3.1)
Lymphs: 17 %
MCH: 30.6 pg (ref 26.6–33.0)
MCHC: 34.5 g/dL (ref 31.5–35.7)
MCV: 89 fL (ref 79–97)
Monocytes Absolute: 0.7 10*3/uL (ref 0.1–0.9)
Monocytes: 6 %
Neutrophils Absolute: 8 10*3/uL — ABNORMAL HIGH (ref 1.4–7.0)
Neutrophils: 73 %
Platelets: 183 10*3/uL (ref 150–450)
RBC: 5.53 x10E6/uL (ref 4.14–5.80)
RDW: 12.5 % (ref 11.6–15.4)
WBC: 10.9 10*3/uL — ABNORMAL HIGH (ref 3.4–10.8)

## 2020-12-26 LAB — LIPID PANEL
Chol/HDL Ratio: 7.1 ratio — ABNORMAL HIGH (ref 0.0–5.0)
Cholesterol, Total: 206 mg/dL — ABNORMAL HIGH (ref 100–199)
HDL: 29 mg/dL — ABNORMAL LOW (ref >39)
LDL Chol Calc (NIH): 126 mg/dL — ABNORMAL HIGH (ref 0–99)
Triglycerides: 285 mg/dL — ABNORMAL HIGH (ref 0–149)
VLDL Cholesterol Cal: 51 mg/dL — ABNORMAL HIGH (ref 5–40)

## 2020-12-26 LAB — CMP14+EGFR
ALT: 67 IU/L — ABNORMAL HIGH (ref 0–44)
AST: 53 IU/L — ABNORMAL HIGH (ref 0–40)
Albumin/Globulin Ratio: 1.6 (ref 1.2–2.2)
Albumin: 4.6 g/dL (ref 4.0–5.0)
Alkaline Phosphatase: 104 IU/L (ref 44–121)
BUN/Creatinine Ratio: 8 — ABNORMAL LOW (ref 9–20)
BUN: 8 mg/dL (ref 6–20)
Bilirubin Total: 0.4 mg/dL (ref 0.0–1.2)
CO2: 21 mmol/L (ref 20–29)
Calcium: 9.6 mg/dL (ref 8.7–10.2)
Chloride: 101 mmol/L (ref 96–106)
Creatinine, Ser: 0.98 mg/dL (ref 0.76–1.27)
Globulin, Total: 2.8 g/dL (ref 1.5–4.5)
Glucose: 218 mg/dL — ABNORMAL HIGH (ref 65–99)
Potassium: 4.9 mmol/L (ref 3.5–5.2)
Sodium: 138 mmol/L (ref 134–144)
Total Protein: 7.4 g/dL (ref 6.0–8.5)
eGFR: 102 mL/min/{1.73_m2} (ref >59)

## 2020-12-26 LAB — BAYER DCA HB A1C WAIVED: HB A1C (BAYER DCA - WAIVED): 9.4 % — ABNORMAL HIGH (ref <7.0)

## 2020-12-26 MED ORDER — OZEMPIC (0.25 OR 0.5 MG/DOSE) 2 MG/1.5ML ~~LOC~~ SOPN: 0.2500 mg | PEN_INJECTOR | SUBCUTANEOUS | 0 refills | Status: DC

## 2020-12-26 MED ORDER — ESCITALOPRAM OXALATE 10 MG PO TABS: 10.0000 mg | ORAL_TABLET | Freq: Every day | ORAL | 2 refills | Status: DC

## 2020-12-26 MED ORDER — ALLOPURINOL 100 MG PO TABS: 100.0000 mg | ORAL_TABLET | Freq: Every day | ORAL | 1 refills | Status: DC

## 2020-12-26 NOTE — Patient Instructions (Signed)
Please schedule eye exam.   Diabetes Mellitus and Nutrition, Adult When you have diabetes, or diabetes mellitus, it is very important to have healthy eating habits because your blood sugar (glucose) levels are greatly affected by what you eat and drink. Eating healthy foods in the right amounts, at about the same times every day, can help you:  Control your blood glucose.  Lower your risk of heart disease.  Improve your blood pressure.  Reach or maintain a healthy weight. What can affect my meal plan? Every person with diabetes is different, and each person has different needs for a meal plan. Your health care provider may recommend that you work with a dietitian to make a meal plan that is best for you. Your meal plan may vary depending on factors such as:  The calories you need.  The medicines you take.  Your weight.  Your blood glucose, blood pressure, and cholesterol levels.  Your activity level.  Other health conditions you have, such as heart or kidney disease. How do carbohydrates affect me? Carbohydrates, also called carbs, affect your blood glucose level more than any other type of food. Eating carbs naturally raises the amount of glucose in your blood. Carb counting is a method for keeping track of how many carbs you eat. Counting carbs is important to keep your blood glucose at a healthy level, especially if you use insulin or take certain oral diabetes medicines. It is important to know how many carbs you can safely have in each meal. This is different for every person. Your dietitian can help you calculate how many carbs you should have at each meal and for each snack. How does alcohol affect me? Alcohol can cause a sudden decrease in blood glucose (hypoglycemia), especially if you use insulin or take certain oral diabetes medicines. Hypoglycemia can be a life-threatening condition. Symptoms of hypoglycemia, such as sleepiness, dizziness, and confusion, are similar to  symptoms of having too much alcohol.  Do not drink alcohol if: ? Your health care provider tells you not to drink. ? You are pregnant, may be pregnant, or are planning to become pregnant.  If you drink alcohol: ? Do not drink on an empty stomach. ? Limit how much you use to:  0-1 drink a day for women.  0-2 drinks a day for men. ? Be aware of how much alcohol is in your drink. In the [place] one drink equals one 12 oz bottle of beer (355 mL), one 5 oz glass of wine (148 mL), or one 1 oz glass of hard liquor (44 mL). ? Keep yourself hydrated with water, diet soda, or unsweetened iced tea.  Keep in mind that regular soda, juice, and other mixers may contain a lot of sugar and must be counted as carbs. What are tips for following this plan? Reading food labels  Start by checking the serving size on the "Nutrition Facts" label of packaged foods and drinks. The amount of calories, carbs, fats, and other nutrients listed on the label is based on one serving of the item. Many items contain more than one serving per package.  Check the total grams (g) of carbs in one serving. You can calculate the number of servings of carbs in one serving by dividing the total carbs by 15. For example, if a food has 30 g of total carbs per serving, it would be equal to 2 servings of carbs.  Check the number of grams (g) of saturated fats and trans fats in  one serving. Choose foods that have a low amount or none of these fats.  Check the number of milligrams (mg) of salt (sodium) in one serving. Most people should limit total sodium intake to less than 2,300 mg per day.  Always check the nutrition information of foods labeled as "low-fat" or "nonfat." These foods may be higher in added sugar or refined carbs and should be avoided.  Talk to your dietitian to identify your daily goals for nutrients listed on the label. Shopping  Avoid buying canned, pre-made, or processed foods. These foods tend to be high in  fat, sodium, and added sugar.  Shop around the outside edge of the grocery store. This is where you will most often find fresh fruits and vegetables, bulk grains, fresh meats, and fresh dairy. Cooking  Use low-heat cooking methods, such as baking, instead of high-heat cooking methods like deep frying.  Cook using healthy oils, such as olive, canola, or sunflower oil.  Avoid cooking with butter, cream, or high-fat meats. Meal planning  Eat meals and snacks regularly, preferably at the same times every day. Avoid going long periods of time without eating.  Eat foods that are high in fiber, such as fresh fruits, vegetables, beans, and whole grains. Talk with your dietitian about how many servings of carbs you can eat at each meal.  Eat 4-6 oz (112-168 g) of lean protein each day, such as lean meat, chicken, fish, eggs, or tofu. One ounce (oz) of lean protein is equal to: ? 1 oz (28 g) of meat, chicken, or fish. ? 1 egg. ?  cup (62 g) of tofu.  Eat some foods each day that contain healthy fats, such as avocado, nuts, seeds, and fish.   What foods should I eat? Fruits Berries. Apples. Oranges. Peaches. Apricots. Plums. Grapes. Mango. Papaya. Pomegranate. Kiwi. Cherries. Vegetables Lettuce. Spinach. Leafy greens, including kale, chard, collard greens, and mustard greens. Beets. Cauliflower. Cabbage. Broccoli. Carrots. Green beans. Tomatoes. Peppers. Onions. Cucumbers. Brussels sprouts. Grains Whole grains, such as whole-wheat or whole-grain bread, crackers, tortillas, cereal, and pasta. Unsweetened oatmeal. Quinoa. Brown or wild rice. Meats and other proteins Seafood. Poultry without skin. Lean cuts of poultry and beef. Tofu. Nuts. Seeds. Dairy Low-fat or fat-free dairy products such as milk, yogurt, and cheese. The items listed above may not be a complete list of foods and beverages you can eat. Contact a dietitian for more information. What foods should I avoid? Fruits Fruits canned  with syrup. Vegetables Canned vegetables. Frozen vegetables with butter or cream sauce. Grains Refined white flour and flour products such as bread, pasta, snack foods, and cereals. Avoid all processed foods. Meats and other proteins Fatty cuts of meat. Poultry with skin. Breaded or fried meats. Processed meat. Avoid saturated fats. Dairy Full-fat yogurt, cheese, or milk. Beverages Sweetened drinks, such as soda or iced tea. The items listed above may not be a complete list of foods and beverages you should avoid. Contact a dietitian for more information. Questions to ask a health care provider  Do I need to meet with a diabetes educator?  Do I need to meet with a dietitian?  What number can I call if I have questions?  When are the best times to check my blood glucose? Where to find more information:  American Diabetes Association: diabetes.org  Academy of Nutrition and Dietetics: www.eatright.[employer] of Diabetes and Digestive and Kidney Diseases: [employer]  Association of Diabetes Care and Education Specialists: www.diabeteseducator.org Summary  It  is important to have healthy eating habits because your blood sugar (glucose) levels are greatly affected by what you eat and drink.  A healthy meal plan will help you control your blood glucose and maintain a healthy lifestyle.  Your health care provider may recommend that you work with a dietitian to make a meal plan that is best for you.  Keep in mind that carbohydrates (carbs) and alcohol have immediate effects on your blood glucose levels. It is important to count carbs and to use alcohol carefully. This information is not intended to replace advice given to you by your health care provider. Make sure you discuss any questions you have with your health care provider. Document Revised: 09/14/2019 Document Reviewed: 09/14/2019 Elsevier Patient Education  2021 [employer].

## 2020-12-26 NOTE — Progress Notes (Signed)
Assessment & Plan:  1. Type 2 diabetes mellitus with hyperglycemia, without long-term current use of insulin (HCC) Lab Results  Component Value Date   HGBA1C 9.4 (H) 12/26/2020   HGBA1C 8.7 (H) 07/21/2018    - Diabetes is not at goal of A1c < 7. - Medications: Not currently on medications. Started Ozempic. - Patient is not currently taking a statin. Patient is not taking an ACE-inhibitor/ARB.  - Instruction/counseling given: reminded to get eye exam, discussed foot care, discussed the need for weight loss, discussed diet and provided printed educational material  Diabetes Health Maintenance Due  Topic Date Due  . OPHTHALMOLOGY EXAM  Never done  . HEMOGLOBIN A1C  06/28/2021  . FOOT EXAM  12/26/2021  . URINE MICROALBUMIN  12/26/2021    Lab Results  Component Value Date   LABMICR 172.5 12/26/2020   - Lipid panel - CBC with Differential/Platelet - CMP14+EGFR - Bayer DCA Hb A1c Waived - Microalbumin / creatinine urine ratio - Semaglutide,0.25 or 0.5MG /DOS, (OZEMPIC, 0.25 OR 0.5 MG/DOSE,) 2 MG/1.5ML SOPN; Inject 0.25 mg into the skin once a week.  Dispense: 1.5 mL; Refill: 0  2. Essential hypertension Slightly elevated today. Lifestyle modifications.  - Lipid panel - CBC with Differential/Platelet - CMP14+EGFR  3. Morbid obesity ([place]) Diet and exercise encouraged.  - Lipid panel - CBC with Differential/Platelet - CMP14+EGFR - Semaglutide,0.25 or 0.5MG /DOS, (OZEMPIC, 0.25 OR 0.5 MG/DOSE,) 2 MG/1.5ML SOPN; Inject 0.25 mg into the skin once a week.  Dispense: 1.5 mL; Refill: 0  4. Idiopathic chronic gout of multiple sites without tophus Well controlled on current regimen.  - allopurinol (ZYLOPRIM) 100 MG tablet; Take 1 tablet (100 mg total) by mouth daily.  Dispense: 90 tablet; Refill: 1  5. Recurrent moderate major depressive disorder with anxiety (HCC) Uncontrolled. Started patient on Lexapro.  - escitalopram (LEXAPRO) 10 MG tablet; Take 1 tablet (10 mg total) by mouth  daily.  Dispense: 30 tablet; Refill: 2  6. Low testosterone Labs to assess.  Patient reports he has a couple of nurses and the family doctor give him his testosterone injections if his levels are low. - Testosterone   Return in about 6 weeks (around 02/06/2021) for Ozempic, BP, Anxiety.  Hendricks Limes, MSN, APRN, FNP-C [facility]  Subjective:    Patient ID: Casey Hoover, male    DOB: 10/18/1981, 40 y.o.   MRN: [id number]  Patient Care Team: Loman Brooklyn, FNP as PCP - General (Family Medicine)   Chief Complaint:  Chief Complaint  Patient presents with  . Diabetes    Check up of chronic medical conditions    HPI: Casey Hoover is a 40 y.o. male presenting on 12/26/2020 for Diabetes (Check up of chronic medical conditions)  Diabetes: Patient presents for follow up of diabetes. Current symptoms include: hyperglycemia. Known diabetic complications: none. Medication compliance: No - patient reports he has been out of medication for 6 to 12 months now. Current diet: in general, an "unhealthy" diet. Current exercise: none. Home blood sugar records: patient does not check sugars. Is he  on ACE inhibitor or angiotensin II receptor blocker? No. Is he on a statin? No.   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   Patient is concerned about his anxiety.  He states he drinks in the evenings to calm his anxiety down.  He reports it takes anywhere from 14-15 beers per evening to  do this.  GAD 7 : Generalized Anxiety Score 12/26/2020 10/22/2016  Nervous, Anxious, on Edge 3 3  Control/stop worrying 3 1  Worry too much - different things 3 1  Trouble relaxing 3 3  Restless 3 3  Easily annoyed or irritable 3 3  Afraid - awful might happen 3 3  Total GAD 7 Score 21 17  Anxiety Difficulty Not difficult at all Not difficult at all   Depression screen [facility] 2/9 12/26/2020 12/26/2020 01/05/2019   Decreased Interest 3 0 0  Down, Depressed, Hopeless 1 0 0  PHQ - 2 Score 4 0 0  Altered sleeping 3 - -  Tired, decreased energy 3 - -  Change in appetite 3 - -  Feeling bad or failure about yourself  2 - -  Trouble concentrating 3 - -  Moving slowly or fidgety/restless 2 - -  Suicidal thoughts 0 - -  PHQ-9 Score 20 - -  Difficult doing work/chores Not difficult at all - -    New complaints: Patient would like his testosterone level checked due to fatigue.  He has a history of low testosterone.  He has done the foam testosterone which did not raise his levels.  He has never done injections.  Social history:  Relevant past medical, surgical, family and social history reviewed and updated as indicated. Interim medical history since our last visit reviewed.  Allergies and medications reviewed and updated.  DATA REVIEWED: CHART IN EPIC  ROS: Negative unless specifically indicated above in HPI.    Current Outpatient Medications:  .  ACCU-CHEK SOFTCLIX LANCETS lancets, USE TO CHECK SUGAR 4 TIMES DAILY, Disp: 100 each, Rfl: 0 .  albuterol (VENTOLIN HFA) 108 (90 Base) MCG/ACT inhaler, Inhale 2 puffs into the lungs every 6 (six) hours as needed for wheezing or shortness of breath., Disp: 18 g, Rfl: 0 .  allopurinol (ZYLOPRIM) 100 MG tablet, Take 1 tablet (100 mg total) by mouth daily., Disp: 90 tablet, Rfl: 0 .  glucose blood (ACCU-CHEK AVIVA) test strip, Use as instructed, Disp: 100 each, Rfl: 12 .  metFORMIN (GLUCOPHAGE-XR) 750 MG 24 hr tablet, Take 1 tablet (750 mg total) by mouth daily with breakfast. (Needs to be seen before next refill), Disp: 30 tablet, Rfl: 0   Allergies  Allergen Reactions  . Sulfa Antibiotics Rash   Past Medical History:  Diagnosis Date  . Diabetes mellitus without complication ([place])   . Gout   . HTN (hypertension)   . OSA (obstructive sleep apnea) 08/04/2014    Past Surgical History:  Procedure Laterality Date  . CHOLECYSTECTOMY    . WISDOM TOOTH  EXTRACTION  2010    Social History   Socioeconomic History  . Marital status: Single    Spouse name: Not on file  . Number of children: Not on file  . Years of education: Not on file  . Highest education level: Not on file  Occupational History  . Occupation: [employer]  Tobacco Use  . Smoking status: Current Every Day Smoker    Packs/day: 1.00    Years: 15.00    Pack years: 15.00    Types: Cigarettes  . Smokeless tobacco: Never Used  Vaping Use  . Vaping Use: Never used  Substance and Sexual Activity  . Alcohol use: Yes    Comment: social  . Drug use: No  . Sexual activity: Not on file  Other Topics Concern  . Not on file  Social History Narrative  . Not on file  Social Determinants of Health   Financial Resource Strain: Not on file  Food Insecurity: Not on file  Transportation Needs: Not on file  Physical Activity: Not on file  Stress: Not on file  Social Connections: Not on file  Intimate Partner Violence: Not on file        Objective:    BP 138/89   Pulse 89   Temp (!) 96.6 F (35.9 C) (Temporal)   Ht 6\' 1"  (1.854 m)   Wt (!) 353 lb 9.6 oz (160.4 kg)   SpO2 96%   BMI 46.65 kg/m   Wt Readings from Last 3 Encounters:  12/26/20 (!) 353 lb 9.6 oz (160.4 kg)  04/16/20 (!) 349 lb (158.3 kg)  01/05/19 (!) 352 lb 12.8 oz (160 kg)    Physical Exam Vitals reviewed.  Constitutional:      General: He is not in acute distress.    Appearance: Normal appearance. He is morbidly obese. He is not ill-appearing, toxic-appearing or diaphoretic.  HENT:     Head: Normocephalic and atraumatic.  Eyes:     General: No scleral icterus.       Right eye: No discharge.        Left eye: No discharge.     Conjunctiva/sclera: Conjunctivae normal.  Cardiovascular:     Rate and Rhythm: Normal rate and regular rhythm.     Heart sounds: Normal heart sounds. No murmur heard. No friction rub. No gallop.   Pulmonary:     Effort: Pulmonary effort is normal. No respiratory  distress.     Breath sounds: Normal breath sounds. No stridor. No wheezing, rhonchi or rales.  Musculoskeletal:        General: Normal range of motion.     Cervical back: Normal range of motion.  Skin:    General: Skin is warm and dry.  Neurological:     Mental Status: He is alert and oriented to person, place, and time. Mental status is at baseline.  Psychiatric:        Mood and Affect: Mood normal.        Behavior: Behavior normal.        Thought Content: Thought content normal.        Judgment: Judgment normal.    Diabetic Foot Exam - Simple   Simple Foot Form Diabetic Foot exam was performed with the following findings: Yes 12/26/2020  8:15 AM  Visual Inspection No deformities, no ulcerations, no other skin breakdown bilaterally: Yes Sensation Testing Intact to touch and monofilament testing bilaterally: Yes Pulse Check Posterior Tibialis and Dorsalis pulse intact bilaterally: Yes Comments     Lab Results  Component Value Date   TSH 1.820 07/21/2018   Lab Results  Component Value Date   WBC 11.8 (H) 04/16/2020   HGB 15.8 04/16/2020   HCT 47.1 04/16/2020   MCV 92.2 04/16/2020   PLT 204 04/16/2020   Lab Results  Component Value Date   NA 135 04/16/2020   K 4.3 04/16/2020   CO2 22 04/16/2020   GLUCOSE 226 (H) 04/16/2020   BUN 9 04/16/2020   CREATININE 1.03 04/16/2020   BILITOT 0.8 04/16/2020   ALKPHOS 75 04/16/2020   AST 40 04/16/2020   ALT 57 (H) 04/16/2020   PROT 7.5 04/16/2020   ALBUMIN 4.1 04/16/2020   CALCIUM 9.5 04/16/2020   ANIONGAP 12 04/16/2020   Lab Results  Component Value Date   CHOL 174 07/21/2018   Lab Results  Component Value Date   HDL  25 (L) 07/21/2018   Lab Results  Component Value Date   LDLCALC 89 07/21/2018   Lab Results  Component Value Date   TRIG 298 (H) 07/21/2018   Lab Results  Component Value Date   CHOLHDL 7.0 (H) 07/21/2018   Lab Results  Component Value Date   HGBA1C 8.7 (H) 07/21/2018

## 2020-12-26 NOTE — Telephone Encounter (Signed)
Status:Approved;Review Type:Prior Auth;Coverage Start Date:11/26/2020;Coverage End Date:12/26/2023 [facility] aware

## 2020-12-26 NOTE — Telephone Encounter (Signed)
(  Key: [id number]) Rx #: [id number] Ozempic (0.25 or 0.5 MG/DOSE) 2MG /1.5ML pen-injectors  Sent to plan

## 2020-12-27 LAB — SPECIMEN STATUS REPORT

## 2020-12-27 LAB — MICROALBUMIN / CREATININE URINE RATIO
Creatinine, Urine: 109.9 mg/dL
Microalb/Creat Ratio: 157 mg/g creat — ABNORMAL HIGH (ref 0–29)
Microalbumin, Urine: 172.5 ug/mL

## 2020-12-27 LAB — TESTOSTERONE: Testosterone: 174 ng/dL — ABNORMAL LOW (ref 264–916)

## 2020-12-29 ENCOUNTER — Other Ambulatory Visit: Payer: Self-pay | Admitting: Family Medicine

## 2020-12-29 ENCOUNTER — Encounter: Payer: Self-pay | Admitting: Family Medicine

## 2020-12-29 DIAGNOSIS — E1121 Type 2 diabetes mellitus with diabetic nephropathy: Secondary | ICD-10-CM | POA: Insufficient documentation

## 2020-12-29 DIAGNOSIS — E1169 Type 2 diabetes mellitus with other specified complication: Secondary | ICD-10-CM | POA: Insufficient documentation

## 2020-12-29 DIAGNOSIS — F1010 Alcohol abuse, uncomplicated: Secondary | ICD-10-CM | POA: Insufficient documentation

## 2020-12-29 DIAGNOSIS — E785 Hyperlipidemia, unspecified: Secondary | ICD-10-CM

## 2020-12-29 DIAGNOSIS — F419 Anxiety disorder, unspecified: Secondary | ICD-10-CM | POA: Insufficient documentation

## 2020-12-29 DIAGNOSIS — F331 Major depressive disorder, recurrent, moderate: Secondary | ICD-10-CM | POA: Insufficient documentation

## 2020-12-29 DIAGNOSIS — E782 Mixed hyperlipidemia: Secondary | ICD-10-CM | POA: Insufficient documentation

## 2020-12-29 DIAGNOSIS — R7989 Other specified abnormal findings of blood chemistry: Secondary | ICD-10-CM

## 2020-12-29 HISTORY — DX: Hyperlipidemia, unspecified: E78.5

## 2020-12-29 HISTORY — DX: Type 2 diabetes mellitus with diabetic nephropathy: E11.21

## 2020-12-29 MED ORDER — TESTOSTERONE CYPIONATE 200 MG/ML IM SOLN: 100.0000 mg | INTRAMUSCULAR | 0 refills | Status: DC

## 2021-01-17 ENCOUNTER — Other Ambulatory Visit: Payer: Self-pay | Admitting: Family Medicine

## 2021-01-17 DIAGNOSIS — F331 Major depressive disorder, recurrent, moderate: Secondary | ICD-10-CM

## 2021-02-07 ENCOUNTER — Encounter: Payer: Self-pay | Admitting: *Deleted

## 2021-02-07 ENCOUNTER — Ambulatory Visit: Payer: BC Managed Care – PPO | Admitting: Family Medicine

## 2021-02-07 ENCOUNTER — Encounter: Payer: Self-pay | Admitting: Family Medicine

## 2021-02-07 ENCOUNTER — Other Ambulatory Visit: Payer: Self-pay

## 2021-02-07 VITALS — BP 139/82 | HR 95 | Temp 96.7°F | Ht 73.0 in | Wt 352.6 lb

## 2021-02-07 DIAGNOSIS — E1165 Type 2 diabetes mellitus with hyperglycemia: Secondary | ICD-10-CM

## 2021-02-07 DIAGNOSIS — R7989 Other specified abnormal findings of blood chemistry: Secondary | ICD-10-CM

## 2021-02-07 DIAGNOSIS — E1121 Type 2 diabetes mellitus with diabetic nephropathy: Secondary | ICD-10-CM

## 2021-02-07 DIAGNOSIS — F331 Major depressive disorder, recurrent, moderate: Secondary | ICD-10-CM

## 2021-02-07 DIAGNOSIS — F419 Anxiety disorder, unspecified: Secondary | ICD-10-CM

## 2021-02-07 DIAGNOSIS — I10 Essential (primary) hypertension: Secondary | ICD-10-CM

## 2021-02-07 MED ORDER — FLUOXETINE HCL 20 MG PO CAPS: 20.0000 mg | ORAL_CAPSULE | Freq: Every day | ORAL | 2 refills | Status: DC

## 2021-02-07 MED ORDER — OZEMPIC (0.25 OR 0.5 MG/DOSE) 2 MG/1.5ML ~~LOC~~ SOPN: 0.5000 mg | PEN_INJECTOR | SUBCUTANEOUS | 0 refills | Status: DC

## 2021-02-07 MED ORDER — LISINOPRIL 10 MG PO TABS: 10.0000 mg | ORAL_TABLET | Freq: Every day | ORAL | 2 refills | Status: DC

## 2021-02-07 NOTE — Progress Notes (Signed)
Assessment & Plan:  1. Type 2 diabetes mellitus with hyperglycemia, without long-term current use of insulin (HCC) Ozempic increased from 0.25 mg to 0.5 mg once weekly.  Plan to add a statin at his next visit. - Semaglutide,0.25 or 0.5MG /DOS, (OZEMPIC, 0.25 OR 0.5 MG/DOSE,) 2 MG/1.5ML SOPN; Inject 0.5 mg into the skin once a week.  Dispense: 3 mL; Refill: 0  2. Diabetic nephropathy associated with type 2 diabetes mellitus (HCC) New diagnosis.  Started on lisinopril.  Working on better control of his diabetes. - lisinopril (ZESTRIL) 10 MG tablet; Take 1 tablet (10 mg total) by mouth daily.  Dispense: 30 tablet; Refill: 2  3. Morbid obesity (HCC) Ozempic increased from 0.25 mg to 0.5 mg once weekly.  Diet and exercise encouraged.  Patient has lost 1 lb since our last visit 6 weeks ago. - Semaglutide,0.25 or 0.5MG /DOS, (OZEMPIC, 0.25 OR 0.5 MG/DOSE,) 2 MG/1.5ML SOPN; Inject 0.5 mg into the skin once a week.  Dispense: 3 mL; Refill: 0  4. Essential hypertension Uncontrolled.  Started patient on lisinopril 10 mg once daily. - lisinopril (ZESTRIL) 10 MG tablet; Take 1 tablet (10 mg total) by mouth daily.  Dispense: 30 tablet; Refill: 2  5. Recurrent moderate major depressive disorder with anxiety (HCC) Uncontrolled.  Patient failed therapy with Lexapro.  Started him on Prozac today. - FLUoxetine (PROZAC) 20 MG capsule; Take 1 capsule (20 mg total) by mouth daily.  Dispense: 30 capsule; Refill: 2  6. Low testosterone Patient has been doing his testosterone injections.   Return in about 7 weeks (around 03/28/2021) for follow-up of chronic medication conditions.  [name], MSN, APRN, FNP-C [facility]  Subjective:    Patient ID: Casey Hoover, male    DOB: 03/25/1981, 40 y.o.   MRN: [id number]  Patient Care Team: Gwenlyn Fudge, FNP as PCP - General (Family Medicine)   Chief Complaint:  Chief Complaint  Patient presents with  . Weight Check  .  Hypertension  . Anxiety    6 week follow up. Patient states that he stopped taking lexapro due to it making him sleep all the time.      HPI: Casey Hoover is a 40 y.o. male presenting on 02/07/2021 for Weight Check, Hypertension, and Anxiety (6 week follow up. Patient states that he stopped taking lexapro due to it making him sleep all the time. /)  Approximately 6 weeks ago patient was started on Ozempic for diabetes and morbid obesity.  He reports he is tolerating the 0.25 mg once weekly with no problems.  He reports his fasting blood glucose readings have been 112-140.   Hypertension: Patient does not check his blood pressure at home.  Low testosterone: Patient has been using the testosterone injections every 2 weeks.  Anxiety: Patient reports he was unable to take the Lexapro as it made him sleep entirely too much and not care about anything.  GAD 7 : Generalized Anxiety Score 02/07/2021 12/26/2020 10/22/2016  Nervous, Anxious, on Edge 1 3 3   Control/stop worrying 2 3 1   Worry too much - different things 2 3 1   Trouble relaxing 2 3 3   Restless 1 3 3   Easily annoyed or irritable 2 3 3   Afraid - awful might happen 1 3 3   Total GAD 7 Score 11 21 17   Anxiety Difficulty Not difficult at all Not difficult at all Not difficult at all   Depression screen [facility] 2/9 02/07/2021 12/26/2020 12/26/2020  Decreased Interest 1 3  0  Down, Depressed, Hopeless 1 1 0  PHQ - 2 Score 2 4 0  Altered sleeping 2 3 -  Tired, decreased energy 2 3 -  Change in appetite 3 3 -  Feeling bad or failure about yourself  1 2 -  Trouble concentrating 1 3 -  Moving slowly or fidgety/restless 1 2 -  Suicidal thoughts 0 0 -  PHQ-9 Score 12 20 -  Difficult doing work/chores Not difficult at all Not difficult at all -    New complaints: None  Social history:  Relevant past medical, surgical, family and social history reviewed and updated as indicated. Interim medical history since our last visit  reviewed.  Allergies and medications reviewed and updated.  DATA REVIEWED: CHART IN EPIC  ROS: Negative unless specifically indicated above in HPI.    Current Outpatient Medications:  .  ACCU-CHEK SOFTCLIX LANCETS lancets, USE TO CHECK SUGAR 4 TIMES DAILY, Disp: 100 each, Rfl: 0 .  albuterol (VENTOLIN HFA) 108 (90 Base) MCG/ACT inhaler, Inhale 2 puffs into the lungs every 6 (six) hours as needed for wheezing or shortness of breath., Disp: 18 g, Rfl: 0 .  allopurinol (ZYLOPRIM) 100 MG tablet, Take 1 tablet (100 mg total) by mouth daily., Disp: 90 tablet, Rfl: 1 .  glucose blood (ACCU-CHEK AVIVA) test strip, Use as instructed, Disp: 100 each, Rfl: 12 .  metFORMIN (GLUCOPHAGE-XR) 750 MG 24 hr tablet, Take 1 tablet (750 mg total) by mouth daily with breakfast. (Needs to be seen before next refill), Disp: 30 tablet, Rfl: 0 .  Semaglutide,0.25 or 0.5MG /DOS, (OZEMPIC, 0.25 OR 0.5 MG/DOSE,) 2 MG/1.5ML SOPN, Inject 0.25 mg into the skin once a week., Disp: 1.5 mL, Rfl: 0 .  testosterone cypionate (DEPOTESTOSTERONE CYPIONATE) 200 MG/ML injection, Inject 0.5 mLs (100 mg total) into the muscle every 14 (fourteen) days., Disp: 10 mL, Rfl: 0   Allergies  Allergen Reactions  . Sulfa Antibiotics Rash   Past Medical History:  Diagnosis Date  . Diabetes mellitus without complication (HCC)   . Diabetic nephropathy (HCC) 12/29/2020  . Gout   . HTN (hypertension)   . Hyperlipidemia 12/29/2020  . Low testosterone   . OSA (obstructive sleep apnea) 08/04/2014    Past Surgical History:  Procedure Laterality Date  . CHOLECYSTECTOMY    . WISDOM TOOTH EXTRACTION  2010    Social History   Socioeconomic History  . Marital status: Married    Spouse name: Not on file  . Number of children: Not on file  . Years of education: Not on file  . Highest education level: Not on file  Occupational History  . Occupation: Grading    Comment: Psychologist, sport and exercise  Tobacco Use  . Smoking status: Current Every Day  Smoker    Packs/day: 1.00    Years: 15.00    Pack years: 15.00    Types: Cigarettes  . Smokeless tobacco: Never Used  Vaping Use  . Vaping Use: Never used  Substance and Sexual Activity  . Alcohol use: Yes    Alcohol/week: 15.0 standard drinks    Types: 15 Cans of beer per week    Comment: Updated 12/26/20  . Drug use: No  . Sexual activity: Not on file  Other Topics Concern  . Not on file  Social History Narrative  . Not on file   Social Determinants of Health   Financial Resource Strain: Not on file  Food Insecurity: Not on file  Transportation Needs: Not on file  Physical Activity:  Not on file  Stress: Not on file  Social Connections: Not on file  Intimate Partner Violence: Not on file        Objective:    BP 139/82   Pulse 95   Temp (!) 96.7 F (35.9 C) (Temporal)   Ht 6\' 1"  (1.854 m)   Wt (!) 352 lb 9.6 oz (159.9 kg)   SpO2 96%   BMI 46.52 kg/m   Wt Readings from Last 3 Encounters:  02/07/21 (!) 352 lb 9.6 oz (159.9 kg)  12/26/20 (!) 353 lb 9.6 oz (160.4 kg)  04/16/20 (!) 349 lb (158.3 kg)    Physical Exam Vitals reviewed.  Constitutional:      General: He is not in acute distress.    Appearance: Normal appearance. He is morbidly obese. He is not ill-appearing, toxic-appearing or diaphoretic.  HENT:     Head: Normocephalic and atraumatic.  Eyes:     General: No scleral icterus.       Right eye: No discharge.        Left eye: No discharge.     Conjunctiva/sclera: Conjunctivae normal.  Cardiovascular:     Rate and Rhythm: Normal rate and regular rhythm.     Heart sounds: Normal heart sounds. No murmur heard. No friction rub. No gallop.   Pulmonary:     Effort: Pulmonary effort is normal. No respiratory distress.     Breath sounds: Normal breath sounds. No stridor. No wheezing, rhonchi or rales.  Musculoskeletal:        General: Normal range of motion.     Cervical back: Normal range of motion.  Skin:    General: Skin is warm and dry.   Neurological:     Mental Status: He is alert and oriented to person, place, and time. Mental status is at baseline.  Psychiatric:        Mood and Affect: Mood normal.        Behavior: Behavior normal.        Thought Content: Thought content normal.        Judgment: Judgment normal.     Lab Results  Component Value Date   TSH 1.820 07/21/2018   Lab Results  Component Value Date   WBC 10.9 (H) 12/26/2020   HGB 16.9 12/26/2020   HCT 49.0 12/26/2020   MCV 89 12/26/2020   PLT 183 12/26/2020   Lab Results  Component Value Date   NA 138 12/26/2020   K 4.9 12/26/2020   CO2 21 12/26/2020   GLUCOSE 218 (H) 12/26/2020   BUN 8 12/26/2020   CREATININE 0.98 12/26/2020   BILITOT 0.4 12/26/2020   ALKPHOS 104 12/26/2020   AST 53 (H) 12/26/2020   ALT 67 (H) 12/26/2020   PROT 7.4 12/26/2020   ALBUMIN 4.6 12/26/2020   CALCIUM 9.6 12/26/2020   ANIONGAP 12 04/16/2020   Lab Results  Component Value Date   CHOL 206 (H) 12/26/2020   Lab Results  Component Value Date   HDL 29 (L) 12/26/2020   Lab Results  Component Value Date   LDLCALC 126 (H) 12/26/2020   Lab Results  Component Value Date   TRIG 285 (H) 12/26/2020   Lab Results  Component Value Date   CHOLHDL 7.1 (H) 12/26/2020   Lab Results  Component Value Date   HGBA1C 9.4 (H) 12/26/2020

## 2021-02-07 NOTE — Patient Instructions (Signed)
Please schedule your diabetic eye exam.

## 2021-03-01 ENCOUNTER — Other Ambulatory Visit: Payer: Self-pay | Admitting: Family Medicine

## 2021-03-01 DIAGNOSIS — F419 Anxiety disorder, unspecified: Secondary | ICD-10-CM

## 2021-03-01 DIAGNOSIS — F331 Major depressive disorder, recurrent, moderate: Secondary | ICD-10-CM

## 2021-03-22 ENCOUNTER — Ambulatory Visit: Payer: BC Managed Care – PPO | Admitting: Family Medicine

## 2021-03-23 ENCOUNTER — Other Ambulatory Visit: Payer: Self-pay | Admitting: Family Medicine

## 2021-03-23 ENCOUNTER — Encounter: Payer: Self-pay | Admitting: Family Medicine

## 2021-03-23 DIAGNOSIS — J019 Acute sinusitis, unspecified: Secondary | ICD-10-CM

## 2021-03-28 ENCOUNTER — Ambulatory Visit: Payer: BC Managed Care – PPO | Admitting: Family Medicine

## 2021-04-09 ENCOUNTER — Telehealth: Payer: Self-pay | Admitting: *Deleted

## 2021-04-09 DIAGNOSIS — R7989 Other specified abnormal findings of blood chemistry: Secondary | ICD-10-CM

## 2021-04-09 DIAGNOSIS — E291 Testicular hypofunction: Secondary | ICD-10-CM

## 2021-04-09 NOTE — Telephone Encounter (Signed)
Testosterone Cypionate 200MG /ML intramuscular solution PA started Key: [id number]  Sent to plan

## 2021-04-16 NOTE — Telephone Encounter (Signed)
Plan sent over forms to be filled out  Filled out and attached labs - waiting on B. Alona Bene sig and then faxing back.

## 2021-04-25 NOTE — Telephone Encounter (Signed)
Outcome Approvedon July 1 CaseId:[id number];Status:Approved;Review Type:Prior Auth;Coverage Start Date:03/10/2021;Coverage End Date:04/20/2022;

## 2021-05-04 ENCOUNTER — Other Ambulatory Visit: Payer: Self-pay | Admitting: Family Medicine

## 2021-05-04 DIAGNOSIS — I10 Essential (primary) hypertension: Secondary | ICD-10-CM

## 2021-05-04 DIAGNOSIS — E1121 Type 2 diabetes mellitus with diabetic nephropathy: Secondary | ICD-10-CM

## 2021-05-14 ENCOUNTER — Other Ambulatory Visit: Payer: Self-pay | Admitting: Family Medicine

## 2021-05-14 DIAGNOSIS — E1165 Type 2 diabetes mellitus with hyperglycemia: Secondary | ICD-10-CM

## 2021-06-05 ENCOUNTER — Other Ambulatory Visit: Payer: Self-pay | Admitting: Family Medicine

## 2021-06-05 DIAGNOSIS — E1165 Type 2 diabetes mellitus with hyperglycemia: Secondary | ICD-10-CM

## 2021-06-05 NOTE — Telephone Encounter (Signed)
Appointment scheduled.

## 2021-06-05 NOTE — Telephone Encounter (Signed)
Casey Hoover NTBS 30 days given 05/15/21

## 2021-06-07 ENCOUNTER — Other Ambulatory Visit: Payer: Self-pay | Admitting: Family Medicine

## 2021-06-07 DIAGNOSIS — F331 Major depressive disorder, recurrent, moderate: Secondary | ICD-10-CM

## 2021-06-11 ENCOUNTER — Ambulatory Visit: Payer: BC Managed Care – PPO | Admitting: Family Medicine

## 2021-08-10 ENCOUNTER — Other Ambulatory Visit: Payer: Self-pay | Admitting: Family Medicine

## 2021-08-10 DIAGNOSIS — E1121 Type 2 diabetes mellitus with diabetic nephropathy: Secondary | ICD-10-CM

## 2021-08-10 DIAGNOSIS — I10 Essential (primary) hypertension: Secondary | ICD-10-CM

## 2021-08-24 ENCOUNTER — Other Ambulatory Visit: Payer: Self-pay | Admitting: Family Medicine

## 2021-08-24 DIAGNOSIS — E1121 Type 2 diabetes mellitus with diabetic nephropathy: Secondary | ICD-10-CM

## 2021-08-24 DIAGNOSIS — I10 Essential (primary) hypertension: Secondary | ICD-10-CM

## 2021-11-30 ENCOUNTER — Telehealth: Payer: Self-pay | Admitting: Family Medicine

## 2021-11-30 NOTE — Telephone Encounter (Signed)
Left message to call back to schedule recheck for diabetes

## 2022-05-30 ENCOUNTER — Ambulatory Visit (INDEPENDENT_AMBULATORY_CARE_PROVIDER_SITE_OTHER): Payer: No Typology Code available for payment source | Admitting: Family Medicine

## 2022-05-30 ENCOUNTER — Encounter: Payer: Self-pay | Admitting: Family Medicine

## 2022-05-30 ENCOUNTER — Other Ambulatory Visit: Payer: Self-pay | Admitting: Family Medicine

## 2022-05-30 VITALS — BP 139/89 | HR 85 | Temp 96.4°F | Ht 73.0 in | Wt 337.6 lb

## 2022-05-30 DIAGNOSIS — M109 Gout, unspecified: Secondary | ICD-10-CM

## 2022-05-30 DIAGNOSIS — J069 Acute upper respiratory infection, unspecified: Secondary | ICD-10-CM | POA: Diagnosis not present

## 2022-05-30 DIAGNOSIS — E1121 Type 2 diabetes mellitus with diabetic nephropathy: Secondary | ICD-10-CM

## 2022-05-30 DIAGNOSIS — I10 Essential (primary) hypertension: Secondary | ICD-10-CM | POA: Diagnosis not present

## 2022-05-30 DIAGNOSIS — E782 Mixed hyperlipidemia: Secondary | ICD-10-CM | POA: Diagnosis not present

## 2022-05-30 DIAGNOSIS — E1165 Type 2 diabetes mellitus with hyperglycemia: Secondary | ICD-10-CM

## 2022-05-30 DIAGNOSIS — B9689 Other specified bacterial agents as the cause of diseases classified elsewhere: Secondary | ICD-10-CM

## 2022-05-30 LAB — BAYER DCA HB A1C WAIVED: HB A1C (BAYER DCA - WAIVED): 9.4 % — ABNORMAL HIGH (ref 4.8–5.6)

## 2022-05-30 MED ORDER — GUAIFENESIN-CODEINE 100-10 MG/5ML PO SYRP: 5.0000 mL | ORAL_SOLUTION | Freq: Three times a day (TID) | ORAL | 0 refills | Status: DC | PRN

## 2022-05-30 MED ORDER — GLUCOSE BLOOD VI STRP: ORAL_STRIP | 12 refills | Status: DC

## 2022-05-30 MED ORDER — ALBUTEROL SULFATE HFA 108 (90 BASE) MCG/ACT IN AERS: 2.0000 | INHALATION_SPRAY | Freq: Four times a day (QID) | RESPIRATORY_TRACT | 0 refills | Status: DC | PRN

## 2022-05-30 MED ORDER — FLUTICASONE PROPIONATE 50 MCG/ACT NA SUSP: 2.0000 | Freq: Every day | NASAL | 2 refills | Status: DC

## 2022-05-30 MED ORDER — ACCU-CHEK SOFTCLIX LANCETS MISC: 0 refills | Status: DC

## 2022-05-30 MED ORDER — AZITHROMYCIN 250 MG PO TABS: ORAL_TABLET | ORAL | 0 refills | Status: DC

## 2022-05-30 MED ORDER — LISINOPRIL 10 MG PO TABS
10.0000 mg | ORAL_TABLET | Freq: Every day | ORAL | 1 refills | Status: DC
  Filled 2022-09-05 – 2022-09-16 (×2): qty 90, 90d supply, fill #0

## 2022-05-30 NOTE — Progress Notes (Signed)
Assessment & Plan:  1. Bacterial upper respiratory infection Education provided on URIs. - azithromycin (ZITHROMAX Z-PAK) 250 MG tablet; Take 2 tablets (500 mg) PO today, then 1 tablet (250 mg) PO daily x4 days.  Dispense: 6 tablet; Refill: 0 - guaiFENesin-codeine (ROBITUSSIN AC) 100-10 MG/5ML syrup; Take 5 mLs by mouth 3 (three) times daily as needed for cough.  Dispense: 120 mL; Refill: 0 - fluticasone (FLONASE) 50 MCG/ACT nasal spray; Place 2 sprays into both nostrils daily.  Dispense: 48 mL; Refill: 2 - albuterol (VENTOLIN HFA) 108 (90 Base) MCG/ACT inhaler; Inhale 2 puffs into the lungs every 6 (six) hours as needed for wheezing or shortness of breath.  Dispense: 18 g; Refill: 0  2. Essential hypertension Uncontrolled. Resuming Lisinopril. - CBC with Differential/Platelet - CMP14+EGFR - Lipid panel - lisinopril (ZESTRIL) 10 MG tablet; Take 1 tablet (10 mg total) by mouth daily.  Dispense: 90 tablet; Refill: 1  3. Type 2 diabetes mellitus with hyperglycemia, without long-term current use of insulin (HCC) A1c pending. - Medications:  not currently on medication. Will address when A1c results. - Home glucose monitoring: encouraged to monitor. - Patient is not currently taking a statin; this needs to be started in the future. Resuming ACE-inhibitor today.  - Instruction/counseling given: discussed the need for weight loss  Diabetes Health Maintenance Due  Topic Date Due   OPHTHALMOLOGY EXAM  Never done   HEMOGLOBIN A1C  06/28/2021   FOOT EXAM  12/26/2021    Lab Results  Component Value Date   LABMICR 172.5 12/26/2020   - CBC with Differential/Platelet - CMP14+EGFR - Lipid panel - Bayer DCA Hb A1c Waived - glucose blood (ACCU-CHEK AVIVA) test strip; Use as instructed  Dispense: 100 each; Refill: 12 - Accu-Chek Softclix Lancets lancets; Use as instructed  Dispense: 100 each; Refill: 0 - Microalbumin / creatinine urine ratio  4. Mixed hyperlipidemia - Lipid panel  5.  Diabetic nephropathy associated with type 2 diabetes mellitus (HCC) Resuming Lisionopril. - CMP14+EGFR - lisinopril (ZESTRIL) 10 MG tablet; Take 1 tablet (10 mg total) by mouth daily.  Dispense: 90 tablet; Refill: 1  6. Morbid obesity (HCC) Congratulated on 15 lb weight loss from last year. Encouraged healthy eating and exercise. - CBC with Differential/Platelet - CMP14+EGFR - Lipid panel  7. Controlled gout Not resuming Allopurinol as he has only had one flare over the past year.   Follow up plan: Return when feeling better, for follow-up of chronic medication conditions.  Casey Boston, MSN, APRN, FNP-C [facility]  Subjective:   Patient ID: Casey Hoover, male    DOB: [birth year], 41 y.o.   MRN: [id number]  HPI: Casey Hoover is a 41 y.o. male presenting on 05/30/2022 for Cough, Ear Pain (Bilateral /), and Nasal Congestion (X 2 weeks )  Patient complains of cough, head/chest congestion, sneezing, sore throat, and ear pain/pressure. Onset of symptoms was 2 weeks ago, unchanged since that time. He is drinking plenty of fluids. Evaluation to date: none. Treatment to date: cough suppressants and Mucinex, Tylenol, OTC sinus medications, and two jars of white liquor . He does smoke.   He has been out of all his medication for over a year and would like to resume taking care of himself.    ROS: Negative unless specifically indicated above in HPI.   Relevant past medical history reviewed and updated as indicated.   Allergies and medications reviewed and updated.   Current Outpatient Medications:    ACCU-CHEK SOFTCLIX LANCETS  lancets, USE TO CHECK SUGAR 4 TIMES DAILY (Patient not taking: Reported on 05/30/2022), Disp: 100 each, Rfl: 0   albuterol (VENTOLIN HFA) 108 (90 Base) MCG/ACT inhaler, Inhale 2 puffs into the lungs every 6 (six) hours as needed for wheezing or shortness of breath. (Patient not taking: Reported on 05/30/2022), Disp: 18 g, Rfl: 0    allopurinol (ZYLOPRIM) 100 MG tablet, Take 1 tablet (100 mg total) by mouth daily. (Patient not taking: Reported on 05/30/2022), Disp: 90 tablet, Rfl: 1   FLUoxetine (PROZAC) 20 MG capsule, TAKE 1 CAPSULE BY MOUTH EVERY DAY (Patient not taking: Reported on 05/30/2022), Disp: 90 capsule, Rfl: 0   fluticasone (FLONASE) 50 MCG/ACT nasal spray, SPRAY 2 SPRAYS INTO EACH NOSTRIL EVERY DAY (Patient not taking: Reported on 05/30/2022), Disp: 48 mL, Rfl: 2   glucose blood (ACCU-CHEK AVIVA) test strip, Use as instructed (Patient not taking: Reported on 05/30/2022), Disp: 100 each, Rfl: 12   lisinopril (ZESTRIL) 10 MG tablet, Take 1 tablet (10 mg total) by mouth daily. (NEEDS TO BE SEEN BEFORE NEXT REFILL) (Patient not taking: Reported on 05/30/2022), Disp: 30 tablet, Rfl: 0   metFORMIN (GLUCOPHAGE-XR) 750 MG 24 hr tablet, Take 1 tablet (750 mg total) by mouth daily with breakfast. (Needs to be seen before next refill) (Patient not taking: Reported on 05/30/2022), Disp: 30 tablet, Rfl: 0   Semaglutide,0.25 or 0.5MG /DOS, (OZEMPIC, 0.25 OR 0.5 MG/DOSE,) 2 MG/1.5ML SOPN, Inject 0.5 mg into the skin once a week. (NEEDS TO BE SEEN BEFORE NEXT REFILL) (Patient not taking: Reported on 05/30/2022), Disp: 1.5 mL, Rfl: 0   testosterone cypionate (DEPOTESTOSTERONE CYPIONATE) 200 MG/ML injection, Inject 0.5 mLs (100 mg total) into the muscle every 14 (fourteen) days. (Patient not taking: Reported on 05/30/2022), Disp: 10 mL, Rfl: 0  Allergies  Allergen Reactions   Sulfa Antibiotics Rash    Objective:   BP 139/89   Pulse 85   Temp (!) 96.4 F (35.8 C) (Temporal)   Ht 6\' 1"  (1.854 m)   Wt (!) 337 lb 9.6 oz (153.1 kg)   SpO2 97%   BMI 44.54 kg/m    Physical Exam Vitals reviewed.  Constitutional:      General: He is not in acute distress.    Appearance: Normal appearance. He is morbidly obese. He is not ill-appearing, toxic-appearing or diaphoretic.  HENT:     Head: Normocephalic and atraumatic.     Right Ear: Ear  canal and external ear normal. There is no impacted cerumen. Tympanic membrane is erythematous and bulging.     Left Ear: Tympanic membrane, ear canal and external ear normal. There is no impacted cerumen.     Nose: Congestion present.     Right Sinus: No maxillary sinus tenderness or frontal sinus tenderness.     Left Sinus: No maxillary sinus tenderness or frontal sinus tenderness.     Mouth/Throat:     Mouth: Mucous membranes are moist.     Pharynx: Oropharynx is clear. No oropharyngeal exudate or posterior oropharyngeal erythema.     Tonsils: No tonsillar exudate or tonsillar abscesses.  Eyes:     General: No scleral icterus.       Right eye: No discharge.        Left eye: No discharge.     Conjunctiva/sclera: Conjunctivae normal.  Cardiovascular:     Rate and Rhythm: Normal rate.  Pulmonary:     Effort: Pulmonary effort is normal. No respiratory distress.  Musculoskeletal:  General: Normal range of motion.     Cervical back: Normal range of motion.  Lymphadenopathy:     Cervical: No cervical adenopathy.  Skin:    General: Skin is warm and dry.  Neurological:     Mental Status: He is alert and oriented to person, place, and time. Mental status is at baseline.  Psychiatric:        Mood and Affect: Mood normal.        Behavior: Behavior normal.        Thought Content: Thought content normal.        Judgment: Judgment normal.

## 2022-05-31 ENCOUNTER — Other Ambulatory Visit: Payer: Self-pay | Admitting: Family Medicine

## 2022-05-31 DIAGNOSIS — E1165 Type 2 diabetes mellitus with hyperglycemia: Secondary | ICD-10-CM

## 2022-05-31 LAB — CBC WITH DIFFERENTIAL/PLATELET
Basophils Absolute: 0.2 10*3/uL (ref 0.0–0.2)
Basos: 1 %
EOS (ABSOLUTE): 0.2 10*3/uL (ref 0.0–0.4)
Eos: 2 %
Hematocrit: 49.8 % (ref 37.5–51.0)
Hemoglobin: 16.8 g/dL (ref 13.0–17.7)
Immature Grans (Abs): 0.1 10*3/uL (ref 0.0–0.1)
Immature Granulocytes: 1 %
Lymphocytes Absolute: 2.2 10*3/uL (ref 0.7–3.1)
Lymphs: 17 %
MCH: 30.5 pg (ref 26.6–33.0)
MCHC: 33.7 g/dL (ref 31.5–35.7)
MCV: 90 fL (ref 79–97)
Monocytes Absolute: 0.8 10*3/uL (ref 0.1–0.9)
Monocytes: 6 %
Neutrophils Absolute: 9.3 10*3/uL — ABNORMAL HIGH (ref 1.4–7.0)
Neutrophils: 73 %
Platelets: 211 10*3/uL (ref 150–450)
RBC: 5.51 x10E6/uL (ref 4.14–5.80)
RDW: 11.8 % (ref 11.6–15.4)
WBC: 12.7 10*3/uL — ABNORMAL HIGH (ref 3.4–10.8)

## 2022-05-31 LAB — LIPID PANEL
Chol/HDL Ratio: 6.6 ratio — ABNORMAL HIGH (ref 0.0–5.0)
Cholesterol, Total: 190 mg/dL (ref 100–199)
HDL: 29 mg/dL — ABNORMAL LOW (ref >39)
LDL Chol Calc (NIH): 110 mg/dL — ABNORMAL HIGH (ref 0–99)
Triglycerides: 294 mg/dL — ABNORMAL HIGH (ref 0–149)
VLDL Cholesterol Cal: 51 mg/dL — ABNORMAL HIGH (ref 5–40)

## 2022-05-31 LAB — CMP14+EGFR
ALT: 51 IU/L — ABNORMAL HIGH (ref 0–44)
AST: 27 IU/L (ref 0–40)
Albumin/Globulin Ratio: 1.7 (ref 1.2–2.2)
Albumin: 4.5 g/dL (ref 4.1–5.1)
Alkaline Phosphatase: 109 IU/L (ref 44–121)
BUN/Creatinine Ratio: 7 — ABNORMAL LOW (ref 9–20)
BUN: 7 mg/dL (ref 6–20)
Bilirubin Total: 0.5 mg/dL (ref 0.0–1.2)
CO2: 22 mmol/L (ref 20–29)
Calcium: 9.7 mg/dL (ref 8.7–10.2)
Chloride: 100 mmol/L (ref 96–106)
Creatinine, Ser: 0.95 mg/dL (ref 0.76–1.27)
Globulin, Total: 2.7 g/dL (ref 1.5–4.5)
Glucose: 280 mg/dL — ABNORMAL HIGH (ref 70–99)
Potassium: 4.4 mmol/L (ref 3.5–5.2)
Sodium: 139 mmol/L (ref 134–144)
Total Protein: 7.2 g/dL (ref 6.0–8.5)
eGFR: 104 mL/min/{1.73_m2} (ref >59)

## 2022-05-31 LAB — MICROALBUMIN / CREATININE URINE RATIO
Creatinine, Urine: 154 mg/dL
Microalb/Creat Ratio: 216 mg/g creat — ABNORMAL HIGH (ref 0–29)
Microalbumin, Urine: 332 ug/mL

## 2022-05-31 MED ORDER — METFORMIN HCL ER 500 MG PO TB24
1000.0000 mg | ORAL_TABLET | Freq: Two times a day (BID) | ORAL | 0 refills | Status: DC
  Filled 2022-09-05 – 2022-09-16 (×2): qty 360, 90d supply, fill #0

## 2022-05-31 MED ORDER — ROSUVASTATIN CALCIUM 5 MG PO TABS
5.0000 mg | ORAL_TABLET | Freq: Every day | ORAL | 2 refills | Status: DC
  Filled 2022-09-05 – 2022-09-16 (×2): qty 30, 30d supply, fill #0
  Filled 2022-11-04: qty 30, 30d supply, fill #1

## 2022-05-31 MED ORDER — METFORMIN HCL ER 500 MG PO TB24: ORAL_TABLET | ORAL | 0 refills | Status: DC

## 2022-06-19 ENCOUNTER — Encounter: Payer: Self-pay | Admitting: Family Medicine

## 2022-06-19 ENCOUNTER — Ambulatory Visit (INDEPENDENT_AMBULATORY_CARE_PROVIDER_SITE_OTHER): Payer: No Typology Code available for payment source | Admitting: Family Medicine

## 2022-06-19 VITALS — BP 129/80 | HR 91 | Temp 96.6°F | Ht 73.0 in | Wt 342.2 lb

## 2022-06-19 DIAGNOSIS — J988 Other specified respiratory disorders: Secondary | ICD-10-CM

## 2022-06-19 DIAGNOSIS — R051 Acute cough: Secondary | ICD-10-CM | POA: Diagnosis not present

## 2022-06-19 LAB — VERITOR FLU A/B WAIVED
Influenza A: NEGATIVE
Influenza B: NEGATIVE

## 2022-06-19 MED ORDER — GUAIFENESIN-CODEINE 100-10 MG/5ML PO SYRP: 5.0000 mL | ORAL_SOLUTION | Freq: Three times a day (TID) | ORAL | 0 refills | Status: DC | PRN

## 2022-06-19 MED ORDER — PREDNISONE 10 MG (21) PO TBPK: ORAL_TABLET | ORAL | 0 refills | Status: DC

## 2022-06-19 MED ORDER — AMOXICILLIN-POT CLAVULANATE 875-125 MG PO TABS: 1.0000 | ORAL_TABLET | Freq: Two times a day (BID) | ORAL | 0 refills | Status: AC

## 2022-06-19 NOTE — Progress Notes (Addendum)
Assessment & Plan:  1. Respiratory infection - amoxicillin-clavulanate (AUGMENTIN) 875-125 MG tablet; Take 1 tablet by mouth 2 (two) times daily for 7 days.  Dispense: 14 tablet; Refill: 0 - predniSONE (STERAPRED UNI-PAK 21 TAB) 10 MG (21) TBPK tablet; As directed x 6 days  Dispense: 21 tablet; Refill: 0 - guaiFENesin-codeine (ROBITUSSIN AC) 100-10 MG/5ML syrup; Take 5 mLs by mouth 3 (three) times daily as needed for cough.  Dispense: 120 mL; Refill: 0  2. Acute cough - Novel Coronavirus, NAA ([facility]) - PENDING - Veritor Flu A/B Waived - NEGATIVE   Follow up plan: Return if symptoms worsen or fail to improve.  [name], MSN, APRN, FNP-C [facility]  Subjective:   Patient ID: Casey Hoover, male    DOB: [birth year], 41 y.o.   MRN: [id number]  HPI: Casey Hoover is a 41 y.o. male presenting on 06/19/2022 for Cough (Coughing up blood once ), Nasal Congestion, Fever (102.3 highest it got last night ), and Shortness of Breath (Patient was seen on 8/10 and states that he got better but not all the way better.  He goo worse the last 2 days )  Patient complains of cough, head/chest congestion, headache, runny nose, sneezing, facial pain/pressure, fever, postnasal drainage, and shortness of breath. Max temp 102.3 last night. Onset of symptoms was 5 weeks ago.  He was treated with azithromycin 3 weeks ago and reports he got somewhat better, but not all the way.  His symptoms worsened significantly over the last 2 days. He is drinking plenty of fluids.  Treatment to date: cough suppressants, nasal steroids, and inhaler .  He does smoke.    ROS: Negative unless specifically indicated above in HPI.   Relevant past medical history reviewed and updated as indicated.   Allergies and medications reviewed and updated.   Current Outpatient Medications:    albuterol (VENTOLIN HFA) 108 (90 Base) MCG/ACT inhaler, Inhale 2 puffs into the lungs every 6 (six) hours as  needed for wheezing or shortness of breath., Disp: 18 g, Rfl: 0   fluticasone (FLONASE) 50 MCG/ACT nasal spray, Place 2 sprays into both nostrils daily., Disp: 48 mL, Rfl: 2   glucose blood (FREESTYLE TEST STRIPS) test strip, Use as directed., Disp: 100 each, Rfl: 12   guaiFENesin-codeine (ROBITUSSIN AC) 100-10 MG/5ML syrup, Take 5 mLs by mouth 3 (three) times daily as needed for cough., Disp: 120 mL, Rfl: 0   Lancets (FREESTYLE) lancets, Use as directed., Disp: 100 each, Rfl: 12   lisinopril (ZESTRIL) 10 MG tablet, Take 1 tablet (10 mg total) by mouth daily., Disp: 90 tablet, Rfl: 1   metFORMIN (GLUCOPHAGE-XR) 500 MG 24 hr tablet, Take 1 tablet by mouth once daily x1 week, then 1 tablet twice daily x1 week, then 2 tablets in AM and 1 tablet in PM x1 weeks, then 2 tablets twice daily., Disp: 70 tablet, Rfl: 0   metFORMIN (GLUCOPHAGE-XR) 500 MG 24 hr tablet, Take 2 tablets (1,000 mg total) by mouth in the morning and at bedtime., Disp: 360 tablet, Rfl: 0   rosuvastatin (CRESTOR) 5 MG tablet, Take 1 tablet (5 mg total) by mouth daily., Disp: 30 tablet, Rfl: 2   FLUoxetine (PROZAC) 20 MG capsule, TAKE 1 CAPSULE BY MOUTH EVERY DAY (Patient not taking: Reported on 05/30/2022), Disp: 90 capsule, Rfl: 0   Semaglutide,0.25 or 0.5MG /DOS, (OZEMPIC, 0.25 OR 0.5 MG/DOSE,) 2 MG/1.5ML SOPN, Inject 0.5 mg into the skin once a week. (NEEDS TO BE SEEN BEFORE  NEXT REFILL) (Patient not taking: Reported on 05/30/2022), Disp: 1.5 mL, Rfl: 0   testosterone cypionate (DEPOTESTOSTERONE CYPIONATE) 200 MG/ML injection, Inject 0.5 mLs (100 mg total) into the muscle every 14 (fourteen) days. (Patient not taking: Reported on 05/30/2022), Disp: 10 mL, Rfl: 0  Allergies  Allergen Reactions   Sulfa Antibiotics Rash    Objective:   BP 129/80   Pulse 91   Temp (!) 96.6 F (35.9 C) (Temporal)   Ht 6\' 1"  (1.854 m)   Wt (!) 342 lb 3.2 oz (155.2 kg)   SpO2 91%   BMI 45.15 kg/m    Physical Exam Vitals reviewed.   Constitutional:      General: He is not in acute distress.    Appearance: Normal appearance. He is diaphoretic. He is not ill-appearing or toxic-appearing.  HENT:     Head: Normocephalic and atraumatic.     Right Ear: Ear canal and external ear normal. There is no impacted cerumen. Tympanic membrane is erythematous.     Left Ear: Tympanic membrane, ear canal and external ear normal. There is no impacted cerumen.     Nose: Congestion and rhinorrhea present. Rhinorrhea is clear.     Right Sinus: No maxillary sinus tenderness or frontal sinus tenderness.     Left Sinus: No maxillary sinus tenderness or frontal sinus tenderness.     Mouth/Throat:     Mouth: Mucous membranes are moist.     Pharynx: Oropharynx is clear. Posterior oropharyngeal erythema present. No oropharyngeal exudate.     Tonsils: No tonsillar exudate or tonsillar abscesses.  Eyes:     General: No scleral icterus.       Right eye: No discharge.        Left eye: No discharge.     Conjunctiva/sclera: Conjunctivae normal.  Cardiovascular:     Rate and Rhythm: Normal rate.  Pulmonary:     Effort: Pulmonary effort is normal. No respiratory distress.     Breath sounds: Wheezing and rhonchi present.  Musculoskeletal:        General: Normal range of motion.     Cervical back: Normal range of motion.  Lymphadenopathy:     Cervical: No cervical adenopathy.  Skin:    General: Skin is warm.  Neurological:     Mental Status: He is alert and oriented to person, place, and time. Mental status is at baseline.  Psychiatric:        Mood and Affect: Mood normal.        Behavior: Behavior normal.        Thought Content: Thought content normal.        Judgment: Judgment normal.

## 2022-06-20 LAB — NOVEL CORONAVIRUS, NAA: SARS-CoV-2, NAA: NOT DETECTED

## 2022-06-25 ENCOUNTER — Other Ambulatory Visit: Payer: Self-pay | Admitting: Family Medicine

## 2022-06-25 DIAGNOSIS — E1165 Type 2 diabetes mellitus with hyperglycemia: Secondary | ICD-10-CM

## 2022-06-26 ENCOUNTER — Other Ambulatory Visit: Payer: Self-pay | Admitting: Family Medicine

## 2022-06-26 DIAGNOSIS — B9689 Other specified bacterial agents as the cause of diseases classified elsewhere: Secondary | ICD-10-CM

## 2022-07-03 ENCOUNTER — Ambulatory Visit (INDEPENDENT_AMBULATORY_CARE_PROVIDER_SITE_OTHER): Payer: No Typology Code available for payment source | Admitting: Family Medicine

## 2022-07-03 ENCOUNTER — Encounter: Payer: Self-pay | Admitting: Family Medicine

## 2022-07-03 VITALS — BP 122/79 | HR 94 | Temp 97.0°F | Resp 20 | Ht 73.0 in | Wt 337.0 lb

## 2022-07-03 DIAGNOSIS — B880 Other acariasis: Secondary | ICD-10-CM | POA: Diagnosis not present

## 2022-07-03 MED ORDER — BETAMETHASONE DIPROPIONATE 0.05 % EX CREA: TOPICAL_CREAM | Freq: Two times a day (BID) | CUTANEOUS | 0 refills | Status: DC

## 2022-07-03 MED ORDER — HYDROXYZINE PAMOATE 25 MG PO CAPS: 25.0000 mg | ORAL_CAPSULE | Freq: Three times a day (TID) | ORAL | 0 refills | Status: DC | PRN

## 2022-07-03 MED ORDER — METHYLPREDNISOLONE ACETATE 80 MG/ML IJ SUSP
80.0000 mg | Freq: Once | INTRAMUSCULAR | Status: AC
  Administered 2022-07-03: 80 mg via INTRAMUSCULAR

## 2022-07-03 NOTE — Progress Notes (Signed)
Assessment & Plan:  1. Chigger bites Education provided on insect bites. - hydrOXYzine (VISTARIL) 25 MG capsule; Take 1 capsule (25 mg total) by mouth every 8 (eight) hours as needed for itching.  Dispense: 30 capsule; Refill: 0 - betamethasone dipropionate 0.05 % cream; Apply topically 2 (two) times daily.  Dispense: 90 g; Refill: 0 - methylPREDNISolone acetate (DEPO-MEDROL) injection 80 mg   Follow up plan: Return in about 4 weeks (around 07/31/2022) for follow-up of chronic medication conditions with [facility].  [name], MSN, APRN, FNP-C [facility]  Subjective:   Patient ID: Casey Hoover, male    DOB: 05-01-1981, 41 y.o.   MRN: [id number]  HPI: Casey Hoover is a 41 y.o. male presenting on 07/03/2022 for Rash (Chiggers ? - noticed yesterday )  Rash: Patient complains of rash involving the abdomen, back, and groin. Rash started 1 day ago. Appearance of rash at onset: Color of lesion(s): red, Texture of lesion(s): raised. Rash has not changed over time. Discomfort associated with rash: is pruritic.  Associated symptoms: none. Denies: fever. Patient has not had previous evaluation of rash. Patient has had previous treatment including calamine lotion, alcohol, lacquer thinner, and lotion.  Response to treatment: not effective. Patient has not had contacts with similar rash. Patient has identified precipitant - he was walking through weeds and was exposed to chiggers.    ROS: Negative unless specifically indicated above in HPI.   Relevant past medical history reviewed and updated as indicated.   Allergies and medications reviewed and updated.   Current Outpatient Medications:    albuterol (VENTOLIN HFA) 108 (90 Base) MCG/ACT inhaler, TAKE 2 PUFFS BY MOUTH EVERY 6 HOURS AS NEEDED FOR WHEEZE OR SHORTNESS OF BREATH, Disp: 18 each, Rfl: 0   fluticasone (FLONASE) 50 MCG/ACT nasal spray, Place 2 sprays into both nostrils daily., Disp: 48 mL, Rfl: 2    glucose blood (FREESTYLE TEST STRIPS) test strip, Use as directed., Disp: 100 each, Rfl: 12   guaiFENesin-codeine (ROBITUSSIN AC) 100-10 MG/5ML syrup, Take 5 mLs by mouth 3 (three) times daily as needed for cough., Disp: 120 mL, Rfl: 0   Lancets (FREESTYLE) lancets, Use as directed., Disp: 100 each, Rfl: 12   lisinopril (ZESTRIL) 10 MG tablet, Take 1 tablet (10 mg total) by mouth daily., Disp: 90 tablet, Rfl: 1   metFORMIN (GLUCOPHAGE-XR) 500 MG 24 hr tablet, Take 1 tablet by mouth once daily x1 week, then 1 tablet twice daily x1 week, then 2 tablets in AM and 1 tablet in PM x1 weeks, then 2 tablets twice daily., Disp: 70 tablet, Rfl: 0   rosuvastatin (CRESTOR) 5 MG tablet, Take 1 tablet (5 mg total) by mouth daily., Disp: 30 tablet, Rfl: 2   metFORMIN (GLUCOPHAGE-XR) 500 MG 24 hr tablet, Take 2 tablets (1,000 mg total) by mouth in the morning and at bedtime., Disp: 360 tablet, Rfl: 0  Allergies  Allergen Reactions   Sulfa Antibiotics Rash    Objective:   BP 122/79   Pulse 94   Temp (!) 97 F (36.1 C)   Resp 20   Ht 6\' 1"  (1.854 m)   Wt (!) 337 lb (152.9 kg)   SpO2 95%   BMI 44.46 kg/m    Physical Exam Vitals reviewed.  Constitutional:      General: He is not in acute distress.    Appearance: Normal appearance. He is not ill-appearing, toxic-appearing or diaphoretic.  HENT:     Head: Normocephalic and atraumatic.  Eyes:     General: No scleral icterus.       Right eye: No discharge.        Left eye: No discharge.     Conjunctiva/sclera: Conjunctivae normal.  Cardiovascular:     Rate and Rhythm: Normal rate.  Pulmonary:     Effort: Pulmonary effort is normal. No respiratory distress.  Musculoskeletal:        General: Normal range of motion.     Cervical back: Normal range of motion.  Skin:    General: Skin is warm and dry.     Findings: Rash present. Rash is papular (abdomen and back - patient unagreeable to me visualizing groin area).  Neurological:     Mental Status:  He is alert and oriented to person, place, and time. Mental status is at baseline.  Psychiatric:        Mood and Affect: Mood normal.        Behavior: Behavior normal.        Thought Content: Thought content normal.        Judgment: Judgment normal.

## 2022-09-04 ENCOUNTER — Other Ambulatory Visit (HOSPITAL_COMMUNITY): Payer: Self-pay

## 2022-09-04 ENCOUNTER — Telehealth: Payer: No Typology Code available for payment source | Admitting: Urgent Care

## 2022-09-04 DIAGNOSIS — J0100 Acute maxillary sinusitis, unspecified: Secondary | ICD-10-CM

## 2022-09-04 DIAGNOSIS — H9201 Otalgia, right ear: Secondary | ICD-10-CM

## 2022-09-04 MED ORDER — AMOXICILLIN-POT CLAVULANATE 875-125 MG PO TABS: 1.0000 | ORAL_TABLET | Freq: Two times a day (BID) | ORAL | 0 refills | Status: AC

## 2022-09-04 MED ORDER — BUDESONIDE 32 MCG/ACT NA SUSP: 2.0000 | Freq: Every day | NASAL | 0 refills | Status: DC

## 2022-09-04 NOTE — Progress Notes (Signed)
Virtual Visit Consent   Casey Hoover, you are scheduled for a virtual visit with a [facility] provider today. Just as with appointments in the office, your consent must be obtained to participate. Your consent will be active for this visit and any virtual visit you may have with one of our providers in the next 365 days. If you have a MyChart account, a copy of this consent can be sent to you electronically.  As this is a virtual visit, video technology does not allow for your provider to perform a traditional examination. This may limit your provider's ability to fully assess your condition. If your provider identifies any concerns that need to be evaluated in person or the need to arrange testing (such as labs, EKG, etc.), we will make arrangements to do so. Although advances in technology are sophisticated, we cannot ensure that it will always work on either your end or our end. If the connection with a video visit is poor, the visit may have to be switched to a telephone visit. With either a video or telephone visit, we are not always able to ensure that we have a secure connection.  By engaging in this virtual visit, you consent to the provision of healthcare and authorize for your insurance to be billed (if applicable) for the services provided during this visit. Depending on your insurance coverage, you may receive a charge related to this service.  I need to obtain your verbal consent now. Are you willing to proceed with your visit today? Casey Hoover has provided verbal consent on 09/04/2022 for a virtual visit (video or telephone). Casey Bees, PA  Date: 09/04/2022 7:22 PM  Virtual Visit via Video Note   I, Casey Hoover, connected with  Casey Hoover  ([id number], 1981/01/29) on 09/04/22 at  7:15 PM EST by a video-enabled telemedicine application and verified that I am speaking with the correct person using two identifiers.  Location: Patient: Virtual Visit  Location Patient: Home Provider: Virtual Visit Location Provider: Home Office   I discussed the limitations of evaluation and management by telemedicine and the availability of in person appointments. The patient expressed understanding and agreed to proceed.    History of Present Illness: Casey Hoover is a 41 y.o. who identifies as a male is being seen today for sinus pain and right ear pain.  HPI: 41yo male with hx of uncontrolled DM presents with a several day hx of sinus pain and congestion, with an onset of R ear pain over the past two days. 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 Usually wears a mask but this time did not. Feels that the exposure to the outdoors caused his sinus pain and congestion. Trouble breathing through nose, post nasal drainage. Has been using flonase without relief. Denies known fever. No cough. States he had an ear infection in the past that led to sepsis so is hoping for immediate treatment. Denies SOB, CP, nuchal rigidity. Some headache to frontal sinuses. Denies pain with movement of ear or jaw, bloody discharge or "whooshing" noises in ear.     Problems:  Patient Active Problem List   Diagnosis Date Noted   Recurrent moderate major depressive disorder with anxiety (HCC) 12/29/2020   Diabetic nephropathy (HCC) 12/29/2020   Mixed hyperlipidemia 12/29/2020   Alcohol abuse 12/29/2020   Type 2 diabetes mellitus with hyperglycemia, without long-term current use of insulin (HCC) 12/26/2020   Low testosterone 12/28/2015  Tobacco abuse 12/26/2015   Essential hypertension 07/11/2015   Morbid obesity (HCC) 07/11/2015   OSA (obstructive sleep apnea) 08/04/2014   Controlled gout 03/07/2014    Allergies:  Allergies  Allergen Reactions   Sulfa Antibiotics Rash   Medications:  Current Outpatient Medications:    amoxicillin-clavulanate (AUGMENTIN) 875-125 MG tablet, Take 1 tablet by mouth 2 (two) times daily with a meal for 10 days., Disp: 20  tablet, Rfl: 0   budesonide ([employer] BUDESONIDE) 32 MCG/ACT nasal spray, Place 2 sprays into both nostrils daily., Disp: 8.43 mL, Rfl: 0   albuterol (VENTOLIN HFA) 108 (90 Base) MCG/ACT inhaler, TAKE 2 PUFFS BY MOUTH EVERY 6 HOURS AS NEEDED FOR WHEEZE OR SHORTNESS OF BREATH, Disp: 18 each, Rfl: 0   betamethasone dipropionate 0.05 % cream, Apply topically 2 (two) times daily., Disp: 90 g, Rfl: 0   glucose blood (FREESTYLE TEST STRIPS) test strip, Use as directed., Disp: 100 each, Rfl: 12   guaiFENesin-codeine (ROBITUSSIN AC) 100-10 MG/5ML syrup, Take 5 mLs by mouth 3 (three) times daily as needed for cough., Disp: 120 mL, Rfl: 0   hydrOXYzine (VISTARIL) 25 MG capsule, Take 1 capsule (25 mg total) by mouth every 8 (eight) hours as needed for itching., Disp: 30 capsule, Rfl: 0   Lancets (FREESTYLE) lancets, Use as directed., Disp: 100 each, Rfl: 12   lisinopril (ZESTRIL) 10 MG tablet, Take 1 tablet (10 mg total) by mouth daily., Disp: 90 tablet, Rfl: 1   metFORMIN (GLUCOPHAGE-XR) 500 MG 24 hr tablet, Take 1 tablet by mouth once daily x1 week, then 1 tablet twice daily x1 week, then 2 tablets in AM and 1 tablet in PM x1 weeks, then 2 tablets twice daily., Disp: 70 tablet, Rfl: 0   metFORMIN (GLUCOPHAGE-XR) 500 MG 24 hr tablet, Take 2 tablets (1,000 mg total) by mouth in the morning and at bedtime., Disp: 360 tablet, Rfl: 0   rosuvastatin (CRESTOR) 5 MG tablet, Take 1 tablet (5 mg total) by mouth daily., Disp: 30 tablet, Rfl: 2  Observations/Objective: Patient is well-developed, well-nourished in no acute distress.  Resting comfortably NAD at home. Nasal quality voice Head is normocephalic, atraumatic.  No labored breathing. No cough. Speech is clear and coherent with logical content.  Patient is alert and oriented at baseline.  No scleral injection or anterior rhinorrhea.  Ear in normal alignment, no visible erythema externally, discharge or other visible abnormality  Assessment and Plan: 1. Acute  non-recurrent maxillary sinusitis  2. Otalgia, right  Suspect developing bacterial sinusitis given exposure to allergens, sx presentation, and hx of such in the past, in the setting of uncontrolled DM. Will start Augmentin BID x 10 days to cover for sinusitis. This will also cover for a possible developing OM on R. Cannot use PO pred due to DM hx, so therefore will change pt's usual flonase to budesonide nasal spray. Also encouraged saline flushes and humidification.  Follow Up Instructions: I discussed the assessment and treatment plan with the patient. The patient was provided an opportunity to ask questions and all were answered. The patient agreed with the plan and demonstrated an understanding of the instructions.  A copy of instructions were sent to the patient via MyChart unless otherwise noted below.    The patient was advised to call back or seek an in-person evaluation if the symptoms worsen or if the condition fails to improve as anticipated.  Time:  I spent 10 minutes with the patient via telehealth technology discussing the above problems/concerns.  [place], PA

## 2022-09-04 NOTE — Patient Instructions (Signed)
Lauralyn Primes Weisbecker, thank you for joining [name], PA for today's virtual visit.  While this provider is not your primary care provider (PCP), if your PCP is located in our provider database this encounter information will be shared with them immediately following your visit.   A [facility] MyChart account gives you access to today's visit and all your visits, tests, and labs performed at [facility] " click here if you don't have a [facility] MyChart account or go to [email or web address]  Consent: (Patient) Casey Hoover provided verbal consent for this virtual visit at the beginning of the encounter.  Current Medications:  Current Outpatient Medications:    amoxicillin-clavulanate (AUGMENTIN) 875-125 MG tablet, Take 1 tablet by mouth 2 (two) times daily with a meal for 10 days., Disp: 20 tablet, Rfl: 0   budesonide ([employer] BUDESONIDE) 32 MCG/ACT nasal spray, Place 2 sprays into both nostrils daily., Disp: 8.43 mL, Rfl: 0   albuterol (VENTOLIN HFA) 108 (90 Base) MCG/ACT inhaler, TAKE 2 PUFFS BY MOUTH EVERY 6 HOURS AS NEEDED FOR WHEEZE OR SHORTNESS OF BREATH, Disp: 18 each, Rfl: 0   betamethasone dipropionate 0.05 % cream, Apply topically 2 (two) times daily., Disp: 90 g, Rfl: 0   glucose blood (FREESTYLE TEST STRIPS) test strip, Use as directed., Disp: 100 each, Rfl: 12   guaiFENesin-codeine (ROBITUSSIN AC) 100-10 MG/5ML syrup, Take 5 mLs by mouth 3 (three) times daily as needed for cough., Disp: 120 mL, Rfl: 0   hydrOXYzine (VISTARIL) 25 MG capsule, Take 1 capsule (25 mg total) by mouth every 8 (eight) hours as needed for itching., Disp: 30 capsule, Rfl: 0   Lancets (FREESTYLE) lancets, Use as directed., Disp: 100 each, Rfl: 12   lisinopril (ZESTRIL) 10 MG tablet, Take 1 tablet (10 mg total) by mouth daily., Disp: 90 tablet, Rfl: 1   metFORMIN (GLUCOPHAGE-XR) 500 MG 24 hr tablet, Take 1 tablet by mouth once daily x1 week, then 1 tablet twice daily x1 week, then 2  tablets in AM and 1 tablet in PM x1 weeks, then 2 tablets twice daily., Disp: 70 tablet, Rfl: 0   metFORMIN (GLUCOPHAGE-XR) 500 MG 24 hr tablet, Take 2 tablets (1,000 mg total) by mouth in the morning and at bedtime., Disp: 360 tablet, Rfl: 0   rosuvastatin (CRESTOR) 5 MG tablet, Take 1 tablet (5 mg total) by mouth daily., Disp: 30 tablet, Rfl: 2   Medications ordered in this encounter:  Meds ordered this encounter  Medications   amoxicillin-clavulanate (AUGMENTIN) 875-125 MG tablet    Sig: Take 1 tablet by mouth 2 (two) times daily with a meal for 10 days.    Dispense:  20 tablet    Refill:  0    Order Specific Question:   Supervising Provider    Answer:   Merrilee Jansky [[id number]]   budesonide ([employer] BUDESONIDE) 32 MCG/ACT nasal spray    Sig: Place 2 sprays into both nostrils daily.    Dispense:  8.43 mL    Refill:  0    Order Specific Question:   Supervising Provider    Answer:   Merrilee Jansky [[id number]]     *If you need refills on other medications prior to your next appointment, please contact your pharmacy*  Follow-Up: Call back or seek an in-person evaluation if the symptoms worsen or if the condition fails to improve as anticipated.  [facility] [phone]  Other Instructions Your symptoms today are likely due to bacterial sinusitis,  with possible developing otitis media on the Right. Purchase a warm mist vaporizer for use at nighttime, continue using steam from shower. Take the augmentin twice daily with food to help prevent upset stomach. Daily yogurt or a probiotic over the counter can help prevent diarrhea. Please start using the nasal spray prescribed today, 1-2 times daily per nostril.  Stop use if nasal bleeding occurs.  Start using a saline nasal wash, see the attached instructions on how to perform.  You may take OTC ibuprofen or tylenol for the discomfort.  Please head to an in person urgent care if your ear and sinus pain symptoms do not  resolve.   If you have been instructed to have an in-person evaluation today at a local Urgent Care facility, please use the link below. It will take you to a list of all of our available [facility], including address, phone number and hours of operation. Please do not delay care.  [facility]  If you or a family member do not have a primary care provider, use the link below to schedule a visit and establish care. When you choose a [place] [facility] primary care physician or advanced practice provider, you gain a long-term partner in health. Find a Primary Care Provider  Learn more about [facility]'s in-office and virtual care options: [facility] - Get Care Now

## 2022-09-05 ENCOUNTER — Other Ambulatory Visit (HOSPITAL_COMMUNITY): Payer: Self-pay

## 2022-09-13 ENCOUNTER — Other Ambulatory Visit (HOSPITAL_COMMUNITY): Payer: Self-pay

## 2022-09-16 ENCOUNTER — Other Ambulatory Visit (HOSPITAL_COMMUNITY): Payer: Self-pay

## 2022-09-24 ENCOUNTER — Other Ambulatory Visit (HOSPITAL_COMMUNITY): Payer: Self-pay

## 2022-11-04 ENCOUNTER — Other Ambulatory Visit (HOSPITAL_COMMUNITY): Payer: Self-pay

## 2022-11-08 ENCOUNTER — Other Ambulatory Visit (HOSPITAL_COMMUNITY): Payer: Self-pay

## 2022-11-28 ENCOUNTER — Telehealth: Payer: 59 | Admitting: Nurse Practitioner

## 2022-11-28 DIAGNOSIS — J0140 Acute pansinusitis, unspecified: Secondary | ICD-10-CM

## 2022-11-28 DIAGNOSIS — R051 Acute cough: Secondary | ICD-10-CM | POA: Diagnosis not present

## 2022-11-28 DIAGNOSIS — R062 Wheezing: Secondary | ICD-10-CM

## 2022-11-28 MED ORDER — PROMETHAZINE-DM 6.25-15 MG/5ML PO SYRP: 5.0000 mL | ORAL_SOLUTION | Freq: Four times a day (QID) | ORAL | 0 refills | Status: AC | PRN

## 2022-11-28 MED ORDER — PREDNISONE 20 MG PO TABS: 20.0000 mg | ORAL_TABLET | Freq: Every day | ORAL | 0 refills | Status: AC

## 2022-11-28 MED ORDER — ALBUTEROL SULFATE HFA 108 (90 BASE) MCG/ACT IN AERS: 2.0000 | INHALATION_SPRAY | Freq: Four times a day (QID) | RESPIRATORY_TRACT | 0 refills | Status: DC | PRN

## 2022-11-28 MED ORDER — AMOXICILLIN-POT CLAVULANATE 875-125 MG PO TABS: 1.0000 | ORAL_TABLET | Freq: Two times a day (BID) | ORAL | 0 refills | Status: AC

## 2022-11-28 NOTE — Progress Notes (Signed)
Virtual Visit Consent   Casey Hoover, you are scheduled for a virtual visit with a [facility] provider today. Just as with appointments in the office, your consent must be obtained to participate. Your consent will be active for this visit and any virtual visit you may have with one of our providers in the next 365 days. If you have a MyChart account, a copy of this consent can be sent to you electronically.  As this is a virtual visit, video technology does not allow for your provider to perform a traditional examination. This may limit your provider's ability to fully assess your condition. If your provider identifies any concerns that need to be evaluated in person or the need to arrange testing (such as labs, EKG, etc.), we will make arrangements to do so. Although advances in technology are sophisticated, we cannot ensure that it will always work on either your end or our end. If the connection with a video visit is poor, the visit may have to be switched to a telephone visit. With either a video or telephone visit, we are not always able to ensure that we have a secure connection.  By engaging in this virtual visit, you consent to the provision of healthcare and authorize for your insurance to be billed (if applicable) for the services provided during this visit. Depending on your insurance coverage, you may receive a charge related to this service.  I need to obtain your verbal consent now. Are you willing to proceed with your visit today? Casey Hoover has provided verbal consent on 11/28/2022 for a virtual visit (video or telephone). Casey Schneiders, FNP  Date: 11/28/2022 11:50 AM  Virtual Visit via Video Note   I, Casey Hoover, connected with  Casey Hoover  ([id number], 09-Oct-1981) on 11/28/22 at 12:00 PM EST by a video-enabled telemedicine application and verified that I am speaking with the correct person using two identifiers.  Location: Patient: Virtual Visit Location  Patient: Home Provider: Virtual Visit Location Provider: Home Office   I discussed the limitations of evaluation and management by telemedicine and the availability of in person appointments. The patient expressed understanding and agreed to proceed.    History of Present Illness: Casey Hoover is a 42 y.o. who identifies as a male who was assigned male at birth, and is being seen today for cough, congestion and fever.  Symptoms onset with sinus congestion and fever initially, he has been trying to push through with OTC medications without relief.   Feels he has gotten worse in the past 48 hours   He has been sick for the past 2 weeks He has been using Mucinex and tylenol cold and sinus OTC  Started going into his chest with a productive couth in the past few days   Denies a history of asthma though He does have an inhaler that he has been using for some relief  Has required prednisone to relieve sinus infections in the past   Problems:  Patient Active Problem List   Diagnosis Date Noted   Recurrent moderate major depressive disorder with anxiety ([place]) 12/29/2020   Diabetic nephropathy ([place]) 12/29/2020   Mixed hyperlipidemia 12/29/2020   Alcohol abuse 12/29/2020   Type 2 diabetes mellitus with hyperglycemia, without long-term current use of insulin ([place]) 12/26/2020   Low testosterone 12/28/2015   Tobacco abuse 12/26/2015   Essential hypertension 07/11/2015   Morbid obesity ([place]) 07/11/2015   OSA (obstructive sleep apnea) 08/04/2014   Controlled gout 03/07/2014  Allergies:  Allergies  Allergen Reactions   Sulfa Antibiotics Rash   Medications:  Current Outpatient Medications:    albuterol (VENTOLIN HFA) 108 (90 Base) MCG/ACT inhaler, TAKE 2 PUFFS BY MOUTH EVERY 6 HOURS AS NEEDED FOR WHEEZE OR SHORTNESS OF BREATH, Disp: 18 each, Rfl: 0   betamethasone dipropionate 0.05 % cream, Apply topically 2 (two) times daily., Disp: 90 g, Rfl: 0   budesonide ([employer] BUDESONIDE) 32  MCG/ACT nasal spray, Place 2 sprays into both nostrils daily., Disp: 8.43 mL, Rfl: 0   glucose blood (FREESTYLE TEST STRIPS) test strip, Use as directed., Disp: 100 each, Rfl: 12   guaiFENesin-codeine (ROBITUSSIN AC) 100-10 MG/5ML syrup, Take 5 mLs by mouth 3 (three) times daily as needed for cough., Disp: 120 mL, Rfl: 0   hydrOXYzine (VISTARIL) 25 MG capsule, Take 1 capsule (25 mg total) by mouth every 8 (eight) hours as needed for itching., Disp: 30 capsule, Rfl: 0   Lancets (FREESTYLE) lancets, Use as directed., Disp: 100 each, Rfl: 12   lisinopril (ZESTRIL) 10 MG tablet, Take 1 tablet (10 mg total) by mouth daily., Disp: 90 tablet, Rfl: 1   metFORMIN (GLUCOPHAGE-XR) 500 MG 24 hr tablet, Take 1 tablet by mouth once daily x1 week, then 1 tablet twice daily x1 week, then 2 tablets in AM and 1 tablet in PM x1 weeks, then 2 tablets twice daily., Disp: 70 tablet, Rfl: 0   metFORMIN (GLUCOPHAGE-XR) 500 MG 24 hr tablet, Take 2 tablets (1,000 mg total) by mouth in the morning and at bedtime., Disp: 360 tablet, Rfl: 0   rosuvastatin (CRESTOR) 5 MG tablet, Take 1 tablet (5 mg total) by mouth daily., Disp: 30 tablet, Rfl: 2  Observations/Objective: Patient is well-developed, well-nourished in no acute distress.  Resting comfortably  at home.  Head is normocephalic, atraumatic.  No labored breathing.  Speech is clear and coherent with logical content.  Patient is alert and oriented at baseline.    Assessment and Plan: 1. Acute non-recurrent pansinusitis  - amoxicillin-clavulanate (AUGMENTIN) 875-125 MG tablet; Take 1 tablet by mouth 2 (two) times daily for 10 days. Take with food  Dispense: 20 tablet; Refill: 0 - albuterol (VENTOLIN HFA) 108 (90 Base) MCG/ACT inhaler; Inhale 2 puffs into the lungs every 6 (six) hours as needed for wheezing or shortness of breath.  Dispense: 8 g; Refill: 0 - predniSONE (DELTASONE) 20 MG tablet; Take 1 tablet (20 mg total) by mouth daily with breakfast for 5 days.   Dispense: 5 tablet; Refill: 0  2. Wheezing  - albuterol (VENTOLIN HFA) 108 (90 Base) MCG/ACT inhaler; Inhale 2 puffs into the lungs every 6 (six) hours as needed for wheezing or shortness of breath.  Dispense: 8 g; Refill: 0 - predniSONE (DELTASONE) 20 MG tablet; Take 1 tablet (20 mg total) by mouth daily with breakfast for 5 days.  Dispense: 5 tablet; Refill: 0  3. Acute cough  - predniSONE (DELTASONE) 20 MG tablet; Take 1 tablet (20 mg total) by mouth daily with breakfast for 5 days.  Dispense: 5 tablet; Refill: 0 - promethazine-dextromethorphan (PROMETHAZINE-DM) 6.25-15 MG/5ML syrup; Take 5 mLs by mouth 4 (four) times daily as needed for up to 7 days for cough.  Dispense: 118 mL; Refill: 0     Follow Up Instructions: I discussed the assessment and treatment plan with the patient. The patient was provided an opportunity to ask questions and all were answered. The patient agreed with the plan and demonstrated an understanding of the instructions.  A  copy of instructions were sent to the patient via MyChart unless otherwise noted below.    The patient was advised to call back or seek an in-person evaluation if the symptoms worsen or if the condition fails to improve as anticipated.  Time:  I spent 15 minutes with the patient via telehealth technology discussing the above problems/concerns.    Casey Schneiders, FNP

## 2022-11-29 ENCOUNTER — Telehealth (INDEPENDENT_AMBULATORY_CARE_PROVIDER_SITE_OTHER): Payer: 59 | Admitting: Family Medicine

## 2022-11-29 DIAGNOSIS — Z91199 Patient's noncompliance with other medical treatment and regimen due to unspecified reason: Secondary | ICD-10-CM

## 2022-12-02 NOTE — Progress Notes (Signed)
No show for appt. Called and LM with no call back x 3. Sent video link x 2 with no answer.

## 2022-12-09 ENCOUNTER — Telehealth: Payer: Self-pay

## 2022-12-09 NOTE — Telephone Encounter (Signed)
Called patient and left message he can make up with DOD just for acute (15)needs to also set up appt with a new pcp when patient calls back because he will need to establish with new provider 62mn. The acu

## 2023-03-26 ENCOUNTER — Ambulatory Visit (INDEPENDENT_AMBULATORY_CARE_PROVIDER_SITE_OTHER): Payer: 59 | Admitting: Family Medicine

## 2023-03-26 ENCOUNTER — Other Ambulatory Visit: Payer: Self-pay

## 2023-03-26 ENCOUNTER — Encounter: Payer: Self-pay | Admitting: Family Medicine

## 2023-03-26 VITALS — BP 144/82 | HR 97 | Temp 97.0°F | Ht 73.0 in | Wt 338.8 lb

## 2023-03-26 DIAGNOSIS — E1169 Type 2 diabetes mellitus with other specified complication: Secondary | ICD-10-CM | POA: Diagnosis not present

## 2023-03-26 DIAGNOSIS — E1159 Type 2 diabetes mellitus with other circulatory complications: Secondary | ICD-10-CM

## 2023-03-26 DIAGNOSIS — F1010 Alcohol abuse, uncomplicated: Secondary | ICD-10-CM | POA: Diagnosis not present

## 2023-03-26 DIAGNOSIS — E1121 Type 2 diabetes mellitus with diabetic nephropathy: Secondary | ICD-10-CM

## 2023-03-26 DIAGNOSIS — F419 Anxiety disorder, unspecified: Secondary | ICD-10-CM

## 2023-03-26 DIAGNOSIS — E785 Hyperlipidemia, unspecified: Secondary | ICD-10-CM

## 2023-03-26 DIAGNOSIS — I152 Hypertension secondary to endocrine disorders: Secondary | ICD-10-CM | POA: Diagnosis not present

## 2023-03-26 DIAGNOSIS — F331 Major depressive disorder, recurrent, moderate: Secondary | ICD-10-CM | POA: Diagnosis not present

## 2023-03-26 DIAGNOSIS — R7989 Other specified abnormal findings of blood chemistry: Secondary | ICD-10-CM

## 2023-03-26 DIAGNOSIS — I10 Essential (primary) hypertension: Secondary | ICD-10-CM | POA: Diagnosis not present

## 2023-03-26 DIAGNOSIS — Z720 Tobacco use: Secondary | ICD-10-CM | POA: Diagnosis not present

## 2023-03-26 DIAGNOSIS — G4733 Obstructive sleep apnea (adult) (pediatric): Secondary | ICD-10-CM | POA: Diagnosis not present

## 2023-03-26 DIAGNOSIS — E782 Mixed hyperlipidemia: Secondary | ICD-10-CM | POA: Diagnosis not present

## 2023-03-26 DIAGNOSIS — E1165 Type 2 diabetes mellitus with hyperglycemia: Secondary | ICD-10-CM

## 2023-03-26 LAB — BAYER DCA HB A1C WAIVED: HB A1C (BAYER DCA - WAIVED): 9.9 % — ABNORMAL HIGH (ref 4.8–5.6)

## 2023-03-26 MED ORDER — BUPROPION HCL ER (XL) 150 MG PO TB24
150.0000 mg | ORAL_TABLET | Freq: Every day | ORAL | 0 refills | Status: DC
Start: 2023-03-26 — End: 2023-05-19
  Filled 2023-03-26: qty 30, 30d supply, fill #0

## 2023-03-26 MED ORDER — SEMAGLUTIDE(0.25 OR 0.5MG/DOS) 2 MG/3ML ~~LOC~~ SOPN
0.5000 mg | PEN_INJECTOR | SUBCUTANEOUS | 0 refills | Status: DC
Start: 2023-04-25 — End: 2023-11-05
  Filled 2023-03-26: qty 3, fill #0
  Filled 2023-05-19: qty 3, 28d supply, fill #0

## 2023-03-26 MED ORDER — ROSUVASTATIN CALCIUM 10 MG PO TABS
10.0000 mg | ORAL_TABLET | Freq: Every day | ORAL | 3 refills | Status: DC
Start: 2023-03-26 — End: 2023-03-28
  Filled 2023-03-26: qty 90, 90d supply, fill #0

## 2023-03-26 MED ORDER — METFORMIN HCL ER 500 MG PO TB24
1000.0000 mg | ORAL_TABLET | Freq: Two times a day (BID) | ORAL | 0 refills | Status: DC
  Filled 2023-03-26: qty 360, 90d supply, fill #0

## 2023-03-26 MED ORDER — SEMAGLUTIDE(0.25 OR 0.5MG/DOS) 2 MG/3ML ~~LOC~~ SOPN
0.2500 mg | PEN_INJECTOR | SUBCUTANEOUS | 0 refills | Status: DC
Start: 2023-03-26 — End: 2023-11-05
  Filled 2023-03-26: qty 3, 30d supply, fill #0

## 2023-03-26 MED ORDER — LISINOPRIL 10 MG PO TABS
10.0000 mg | ORAL_TABLET | Freq: Every day | ORAL | 1 refills | Status: DC
  Filled 2023-03-26: qty 90, 90d supply, fill #0
  Filled 2023-07-09: qty 90, 90d supply, fill #1

## 2023-03-26 MED ORDER — SEMAGLUTIDE (1 MG/DOSE) 4 MG/3ML ~~LOC~~ SOPN
1.0000 mg | PEN_INJECTOR | SUBCUTANEOUS | 0 refills | Status: DC
  Filled 2023-03-26 – 2023-07-09 (×2): qty 3, 28d supply, fill #0

## 2023-03-26 NOTE — Patient Instructions (Addendum)
Monitoring your BP at home   Your BP was elevated today in office  Please keep a log of your BP at home.  The best time to take BP is 1st thing in the morning after waking.  Sit for 5 minutes with feet flat on the floor, arm at heart level.  Options for BP cuffs are at [employer], [employer], [facility], [facility] & [facility].  We will review measurements at follow up and determine plan for BP management.  If you have access, you can send a message in MyChart with your measurements prior to your follow up appointment.  The brand I recommend to get is Omron (Bronze).   Tips for success with Ozempic (and by success, how not to be super sick on your stomach): Eat small meals AVOID heavy foods (fried/ high in carbs like bread, pasta, rice) AVOID carbonated beverages (soda/ beer, as these can increase bloating) DOUBLE your water intake (will help you avoid constipation/ dehydration)  Ozempic CAN cause: Nausea Abdominal pain Increased acid reflux (sometimes presents as "sour burps") Constipation OR Diarrhea Fatigue (especially when you first start it)

## 2023-03-26 NOTE — Progress Notes (Signed)
New Patient Office Visit  Subjective   Patient ID: Casey Hoover, male    DOB: 1981/07/29  Age: 42 y.o. MRN: [id number]  CC:  Chief Complaint  Patient presents with   Establish Care    [facility] patient.    Diabetes    Sugar is high    Hyperlipidemia   Hypertension   HPI Casey Hoover presents to establish care  Type 2 diabetes mellitus with hyperglycemia, without long-term current use of insulin (HCC) and diabetic nephropathy  His main concern today is that he feels his blood glucose is high. Reports that he took it every morning before eating for the past week straight. He was averaging 180-230s in the morning. Reports that it went to 200s-300s after lunch. Reports that he was previously on ozempic and he was controlled several years ago, but has since come off of ozempic.  Continues to take metformin. States that he takes two every night.   OSA (obstructive sleep apnea) States that he is always tired. States that he tried testosterone in the past.  Reports that he does not use his CPAP machine.   Hypertension associated with diabetes (HCC) Has monitor at home. Reports that most of the time it is 118/80s. Denies chest pain, swelling in legs, shortness of breath.  Hyperlipidemia associated with type 2 diabetes mellitus (HCC) Endorses fatigue and poor exercise tolerance. There is a family history of hyperlipidemia.   Tobacco abuse Continues to smoke 1- 1.5 PPD, can smoke up to 2-3 PPD. Started smoking at 18.  Expresses a desire to quit.   Morbid obesity (HCC) States that he has lost some weight  Recurrent moderate major depressive disorder with anxiety (HCC) Alcohol abuse Drinks about 12 beers per night. States that he drinks because he is stressed out. States that he does not drink during the day, but needs it to calm down to go to sleep. States that he gets irritated often and people make him upset.  States that he has tried a few things for stress, but that it  made him too sleepy during the day.   Outpatient Encounter Medications as of 03/26/2023  Medication Sig   lisinopril (ZESTRIL) 10 MG tablet Take 1 tablet (10 mg total) by mouth daily.   metFORMIN (GLUCOPHAGE-XR) 500 MG 24 hr tablet Take 2 tablets (1,000 mg total) by mouth in the morning and at bedtime.   rosuvastatin (CRESTOR) 5 MG tablet Take 1 tablet (5 mg total) by mouth daily.   budesonide ([facility] BUDESONIDE) 32 MCG/ACT nasal spray Place 2 sprays into both nostrils daily. (Patient not taking: Reported on 03/26/2023)   glucose blood (FREESTYLE TEST STRIPS) test strip Use as directed. (Patient not taking: Reported on 03/26/2023)   Lancets (FREESTYLE) lancets Use as directed.   [DISCONTINUED] albuterol (VENTOLIN HFA) 108 (90 Base) MCG/ACT inhaler TAKE 2 PUFFS BY MOUTH EVERY 6 HOURS AS NEEDED FOR WHEEZE OR SHORTNESS OF BREATH   [DISCONTINUED] albuterol (VENTOLIN HFA) 108 (90 Base) MCG/ACT inhaler Inhale 2 puffs into the lungs every 6 (six) hours as needed for wheezing or shortness of breath.   [DISCONTINUED] betamethasone dipropionate 0.05 % cream Apply topically 2 (two) times daily.   [DISCONTINUED] hydrOXYzine (VISTARIL) 25 MG capsule Take 1 capsule (25 mg total) by mouth every 8 (eight) hours as needed for itching.   [DISCONTINUED] metFORMIN (GLUCOPHAGE-XR) 500 MG 24 hr tablet Take 1 tablet by mouth once daily x1 week, then 1 tablet twice daily x1 week, then 2 tablets in AM  and 1 tablet in PM x1 weeks, then 2 tablets twice daily.   No facility-administered encounter medications on file as of 03/26/2023.    Past Medical History:  Diagnosis Date   Diabetes mellitus without complication (HCC)    Diabetic nephropathy (HCC) 12/29/2020   Gout    HTN (hypertension)    Hyperlipidemia 12/29/2020   Low testosterone    OSA (obstructive sleep apnea) 08/04/2014    Past Surgical History:  Procedure Laterality Date   CHOLECYSTECTOMY     WISDOM TOOTH EXTRACTION  2010    Family History  Problem Relation  Age of Onset   Hypertension Father    Heart attack Father    Diabetes Mother    Hypertension Other        multiple family members   Diabetes Other        multiple family members   Heart Problems Other        multiple family members    Social History   Socioeconomic History   Marital status: Married    Spouse name: Not on file   Number of children: Not on file   Years of education: Not on file   Highest education level: 12th grade  Occupational History   Occupation: Grading    Comment: Psychologist, sport and exercise  Tobacco Use   Smoking status: Every Day    Packs/day: 1.00    Years: 15.00    Additional pack years: 0.00    Total pack years: 15.00    Types: Cigarettes   Smokeless tobacco: Never  Vaping Use   Vaping Use: Never used  Substance and Sexual Activity   Alcohol use: Yes    Alcohol/week: 15.0 standard drinks of alcohol    Types: 15 Cans of beer per week    Comment: Updated 12/26/20   Drug use: No   Sexual activity: Not on file  Other Topics Concern   Not on file  Social History Narrative   Not on file   Social Determinants of Health   Financial Resource Strain: Low Risk  (03/25/2023)   Overall Financial Resource Strain (CARDIA)    Difficulty of Paying Living Expenses: Not hard at all  Food Insecurity: No Food Insecurity (03/25/2023)   Hunger Vital Sign    Worried About Running Out of Food in the Last Year: Never true    Ran Out of Food in the Last Year: Never true  Transportation Needs: No Transportation Needs (03/25/2023)   PRAPARE - Administrator, Civil Service (Medical): No    Lack of Transportation (Non-Medical): No  Physical Activity: Unknown (03/25/2023)   Exercise Vital Sign    Days of Exercise per Week: Patient declined    Minutes of Exercise per Session: Not on file  Stress: Patient Declined (03/25/2023)   [employer] of Occupational Health - Occupational Stress Questionnaire    Feeling of Stress : Patient declined  Social Connections:  Moderately Integrated (03/25/2023)   Social Connection and Isolation Panel [NHANES]    Frequency of Communication with Friends and Family: More than three times a week    Frequency of Social Gatherings with Friends and Family: More than three times a week    Attends Religious Services: More than 4 times per year    Active Member of [employer] or Organizations: No    Attends Engineer, structural: Not on file    Marital Status: Married  Catering manager Violence: Not on file    ROS As per HPI  Objective   BP (!) 144/82   Pulse 97   Temp (!) 97 F (36.1 C) (Temporal)   Ht 6\' 1"  (1.854 m)   Wt (!) 338 lb 12.8 oz (153.7 kg)   SpO2 96%   BMI 44.70 kg/m   Physical Exam Constitutional:      General: He is awake. He is not in acute distress.    Appearance: Normal appearance. He is well-developed and well-groomed. He is obese. He is not ill-appearing, toxic-appearing or diaphoretic.  Cardiovascular:     Rate and Rhythm: Normal rate.     Pulses: Normal pulses.          Radial pulses are 2+ on the right side and 2+ on the left side.       Posterior tibial pulses are 2+ on the right side and 2+ on the left side.     Heart sounds: Normal heart sounds. No murmur heard.    No gallop.  Pulmonary:     Effort: Pulmonary effort is normal. No respiratory distress.     Breath sounds: Normal breath sounds. Decreased air movement present. No stridor. No wheezing, rhonchi or rales.  Abdominal:     General: Bowel sounds are normal.     Palpations: Abdomen is soft.  Musculoskeletal:     Cervical back: Full passive range of motion without pain and neck supple.     Right lower leg: No edema.     Left lower leg: No edema.  Skin:    General: Skin is warm.     Capillary Refill: Capillary refill takes less than 2 seconds.  Neurological:     General: No focal deficit present.     Mental Status: He is alert, oriented to person, place, and time and easily aroused. Mental status is at baseline.      GCS: GCS eye subscore is 4. GCS verbal subscore is 5. GCS motor subscore is 6.     Motor: No weakness.  Psychiatric:        Attention and Perception: Attention and perception normal.        Mood and Affect: Mood and affect normal.        Speech: Speech normal.        Behavior: Behavior normal. Behavior is cooperative.        Thought Content: Thought content normal. Thought content does not include homicidal or suicidal ideation. Thought content does not include homicidal or suicidal plan.        Cognition and Memory: Cognition and memory normal.        Judgment: Judgment normal.       03/26/2023    8:40 AM 07/03/2022    9:59 AM 06/19/2022    9:18 AM  Depression screen PHQ 2/9  Decreased Interest 1 0 0  Down, Depressed, Hopeless 0 0 0  PHQ - 2 Score 1 0 0  Altered sleeping 1    Tired, decreased energy 3    Change in appetite 0    Feeling bad or failure about yourself  0    Trouble concentrating 1    Moving slowly or fidgety/restless 0    Suicidal thoughts 0    PHQ-9 Score 6    Difficult doing work/chores Somewhat difficult        03/26/2023    8:41 AM 06/19/2022    9:18 AM 05/30/2022   10:17 AM 02/07/2021    9:44 AM  GAD 7 : Generalized Anxiety Score  Nervous, Anxious, on Edge  1 0 1 1  Control/stop worrying 1 0 0 2  Worry too much - different things 1 0 1 2  Trouble relaxing 1 0 1 2  Restless 1 0 0 1  Easily annoyed or irritable 1 0 1 2  Afraid - awful might happen 0 0 0 1  Total GAD 7 Score 6 0 4 11  Anxiety Difficulty Not difficult at all Not difficult at all Somewhat difficult Not difficult at all   The ASCVD Risk score (Arnett DK, et al., 2019) failed to calculate for the following reasons:   The 2019 ASCVD risk score is only valid for ages 8 to 89  Assessment & Plan:  1. Type 2 diabetes mellitus with hyperglycemia, without long-term current use of insulin (HCC) A1C not at goal today, reviewed with patient in office. A1C 9.9%. Labs as below. Will communicate results to  patient once available. Will continue metformin and start semaglutide. Discussed with patient following up in 3 months with repeat A1C. Will consider increasing dose of metformin. Need to complete foot exam and nephropathy screen.  - Bayer DCA Hb A1c Waived - CBC with Differential/Platelet - CMP14+EGFR - metFORMIN (GLUCOPHAGE-XR) 500 MG 24 hr tablet; Take 2 tablets (1,000 mg total) by mouth in the morning and at bedtime.  Dispense: 360 tablet; Refill: 0 - Semaglutide,0.25 or 0.5MG /DOS, 2 MG/3ML SOPN; Inject 0.25 mg into the skin once a week.  Dispense: 3 mL; Refill: 0 - Semaglutide,0.25 or 0.5MG /DOS, 2 MG/3ML SOPN; Inject 0.5 mg into the skin once a week.  Dispense: 3 mL; Refill: 0 - Semaglutide, 1 MG/DOSE, 4 MG/3ML SOPN; Inject 1 mg as directed once a week.  Dispense: 3 mL; Refill: 0  2. Diabetic nephropathy associated with type 2 diabetes mellitus (HCC) Continue medication as below. Will collect microalbumin at follow up.  - lisinopril (ZESTRIL) 10 MG tablet; Take 1 tablet (10 mg total) by mouth daily.  Dispense: 90 tablet; Refill: 1  3. OSA (obstructive sleep apnea) Patient declined follow up with sleep study at this time. Does not use cpap machine. Discussed with patient the risks of untreated sleep apnea. Chart review of home sleep test from 10/03/2014 revealed severe sleep apnea. Will continue to encourage patient to follow up and consider wearing CPAP.   4. Hypertension associated with diabetes (HCC) Will refill medication as below. Not at goal in office. At goal per patient at home. Discussed with patient monitoring BP at home. Provided BP log to patient in clinic today. Instructed pt to take BP first thing in the morning after sitting for 5 minutes with feet flat on the floor, arm at heart level. Discussed with patient options for BP cuff at [place], [employer], [facility]. Pt verbalized they were able to obtain BP cuff. Will review measurements with patient at follow up and determine plan  for BP management.  - lisinopril (ZESTRIL) 10 MG tablet; Take 1 tablet (10 mg total) by mouth daily.  Dispense: 90 tablet; Refill: 1  5. Hyperlipidemia associated with type 2 diabetes mellitus (HCC) Labs as below. Will communicate results to patient once available.  Will increase crestor as below to 10 mg.  - Lipid panel - rosuvastatin (CRESTOR) 10 MG tablet; Take 1 tablet (10 mg total) by mouth daily.  Dispense: 90 tablet; Refill: 3  6. Recurrent moderate major depressive disorder with anxiety (HCC) Will start Wellbutrin as below to assist with stress management and curbing the desire for tobacco and alcohol. Educated patient that Wellbutrin may cause  an increase in anxiety symptoms. Denies SI. BBW reviewed.  - buPROPion (WELLBUTRIN XL) 150 MG 24 hr tablet; Take 1 tablet (150 mg total) by mouth daily.  Dispense: 30 tablet; Refill: 0  7. Tobacco abuse Wellbutrin as above. Discussed with patient other options. Can consider nicotine patches at follow up.  8. Morbid obesity (HCC) Discussed with patient to continue healthy lifestyle choices, including diet (rich in fruits, vegetables, and lean proteins, and low in salt and simple carbohydrates) and exercise (at least 30 minutes of moderate physical activity daily). Limit beverages high is sugar. Recommended at least 80-100 oz of water daily.   9. Alcohol abuse Discussed with patient desire to decrease intake of alcohol. States that he periodically takes weeks off from drinking and does not experience withdrawals. Discussed withdrawal symptoms with patient. Starting Wellbutrin as above to assist with stress management and curbing the desire for tobacco and alcohol.   The above assessment and management plan was discussed with the patient. The patient verbalized understanding of and has agreed to the management plan using shared-decision making. Patient is aware to call the clinic if they develop any new symptoms or if symptoms fail to improve or  worsen. Patient is aware when to return to the clinic for a follow-up visit. Patient educated on when it is appropriate to go to the emergency department.   Return in about 4 weeks (around 04/23/2023) for Chronic Condition Follow up.   Neale Burly, DNP-FNP [facility] [address] [place], [place] [address] [phone]

## 2023-03-27 LAB — CBC WITH DIFFERENTIAL/PLATELET
Basophils Absolute: 0.1 10*3/uL (ref 0.0–0.2)
Basos: 1 %
EOS (ABSOLUTE): 0.2 10*3/uL (ref 0.0–0.4)
Eos: 2 %
Hematocrit: 49.2 % (ref 37.5–51.0)
Hemoglobin: 16.7 g/dL (ref 13.0–17.7)
Immature Grans (Abs): 0.1 10*3/uL (ref 0.0–0.1)
Immature Granulocytes: 1 %
Lymphocytes Absolute: 2.5 10*3/uL (ref 0.7–3.1)
Lymphs: 23 %
MCH: 30.3 pg (ref 26.6–33.0)
MCHC: 33.9 g/dL (ref 31.5–35.7)
MCV: 89 fL (ref 79–97)
Monocytes Absolute: 0.7 10*3/uL (ref 0.1–0.9)
Monocytes: 6 %
Neutrophils Absolute: 7.1 10*3/uL — ABNORMAL HIGH (ref 1.4–7.0)
Neutrophils: 67 %
Platelets: 217 10*3/uL (ref 150–450)
RBC: 5.51 x10E6/uL (ref 4.14–5.80)
RDW: 12.4 % (ref 11.6–15.4)
WBC: 10.7 10*3/uL (ref 3.4–10.8)

## 2023-03-27 LAB — LIPID PANEL
Chol/HDL Ratio: 8.9 ratio — ABNORMAL HIGH (ref 0.0–5.0)
Cholesterol, Total: 213 mg/dL — ABNORMAL HIGH (ref 100–199)
HDL: 24 mg/dL — ABNORMAL LOW (ref >39)
LDL Chol Calc (NIH): 72 mg/dL (ref 0–99)
Triglycerides: 743 mg/dL (ref 0–149)
VLDL Cholesterol Cal: 117 mg/dL — ABNORMAL HIGH (ref 5–40)

## 2023-03-27 LAB — CMP14+EGFR
ALT: 54 IU/L — ABNORMAL HIGH (ref 0–44)
AST: 29 IU/L (ref 0–40)
Albumin/Globulin Ratio: 1.8 (ref 1.2–2.2)
Albumin: 4.6 g/dL (ref 4.1–5.1)
Alkaline Phosphatase: 111 IU/L (ref 44–121)
BUN/Creatinine Ratio: 10 (ref 9–20)
BUN: 10 mg/dL (ref 6–20)
Bilirubin Total: 0.4 mg/dL (ref 0.0–1.2)
CO2: 23 mmol/L (ref 20–29)
Calcium: 9.8 mg/dL (ref 8.7–10.2)
Chloride: 99 mmol/L (ref 96–106)
Creatinine, Ser: 1 mg/dL (ref 0.76–1.27)
Globulin, Total: 2.6 g/dL (ref 1.5–4.5)
Glucose: 224 mg/dL — ABNORMAL HIGH (ref 70–99)
Potassium: 4.9 mmol/L (ref 3.5–5.2)
Sodium: 137 mmol/L (ref 134–144)
Total Protein: 7.2 g/dL (ref 6.0–8.5)
eGFR: 98 mL/min/{1.73_m2} (ref >59)

## 2023-03-27 NOTE — Progress Notes (Signed)
Can we clarify if patient was fasting when labs were collected? If he is tolerating crestor, would like for him to start taking 20 mg (2 tablets). If he is fasting, we will max out crestor, recheck labs in 3 months and consider adding on another medication. Please advise patient to increase intake of fresh fruits and vegetables, increase intake of lean proteins. Bake, broil, or grill foods. Avoid fried, greasy, and fatty foods. Avoid fast foods. Increase intake of fiber-rich whole grains. Exercise encouraged - at least 150 minutes per week and advance as tolerated. Can also try red yeast rice and fish oil capsules. In addition, alcohol can increase triglycerides and contribute to pancreatitis. Recommend decreasing consumption of alcohol. ALT (liver enzyme) remains elevated, stable from prior. Given consistent elevations will order liver US to further assess.

## 2023-03-27 NOTE — Addendum Note (Signed)
Addended by: Neale Burly on: 03/27/2023 08:04 AM   Modules accepted: Orders

## 2023-03-28 ENCOUNTER — Other Ambulatory Visit (HOSPITAL_COMMUNITY): Payer: Self-pay

## 2023-03-28 ENCOUNTER — Other Ambulatory Visit: Payer: Self-pay

## 2023-03-28 MED ORDER — FENOFIBRATE 48 MG PO TABS
48.0000 mg | ORAL_TABLET | Freq: Every day | ORAL | 1 refills | Status: DC
  Filled 2023-03-28: qty 30, 30d supply, fill #0
  Filled 2023-05-19: qty 30, 30d supply, fill #1

## 2023-03-28 MED ORDER — ROSUVASTATIN CALCIUM 40 MG PO TABS
40.0000 mg | ORAL_TABLET | Freq: Every day | ORAL | 0 refills | Status: DC
  Filled 2023-03-28: qty 90, 90d supply, fill #0

## 2023-03-28 NOTE — Addendum Note (Signed)
Addended by: Neale Burly on: 03/28/2023 10:34 AM   Modules accepted: Orders

## 2023-03-28 NOTE — Progress Notes (Signed)
Given that patient was fasting. Would like him to start taking 40 mg of Crestor. He can take 4 tablets of 10 mg crestor until his current supply is complete. Will send in refills of 40 mg crestor after this. Please call pharmacy and cancel the refills on the Crestor 10 mg. Will also start Tricor 48 mg as well. Will have patient repeat levels in one month. If not improved, will refer to Lipid clinic.

## 2023-04-02 ENCOUNTER — Ambulatory Visit (HOSPITAL_COMMUNITY)
Admission: RE | Admit: 2023-04-02 | Discharge: 2023-04-02 | Disposition: A | Discharge status: Home / Self Care | Payer: 59 | Source: Ambulatory Visit | Attending: Family Medicine | Admitting: Family Medicine

## 2023-04-02 DIAGNOSIS — R7989 Other specified abnormal findings of blood chemistry: Secondary | ICD-10-CM | POA: Insufficient documentation

## 2023-04-02 DIAGNOSIS — F1010 Alcohol abuse, uncomplicated: Secondary | ICD-10-CM | POA: Diagnosis not present

## 2023-04-02 DIAGNOSIS — R945 Abnormal results of liver function studies: Secondary | ICD-10-CM | POA: Diagnosis not present

## 2023-04-02 NOTE — Progress Notes (Signed)
Per report, steatosis or fatty liver found on imaging. Recommend diet and exercise. We will work to improve blood pressure control, weight, and cholesterol. Recommend avoiding liver toxins such as alcohol and medications eliminated by the liver like tylenol. Notify the office if there are worsening symptoms of liver cirrhosis such as blood in your bowel movements or vomit, symptoms of infection, belly pain, swollen legs or ankles, trouble breathing, extreme tiredness, confusion, yellowing of the skin or whites of your eyes, called jaundice.

## 2023-04-25 ENCOUNTER — Encounter: Payer: Self-pay | Admitting: Family Medicine

## 2023-04-25 ENCOUNTER — Ambulatory Visit: Payer: 59 | Admitting: Family Medicine

## 2023-05-19 ENCOUNTER — Other Ambulatory Visit: Payer: Self-pay | Admitting: Family Medicine

## 2023-05-19 DIAGNOSIS — F419 Anxiety disorder, unspecified: Secondary | ICD-10-CM

## 2023-05-20 ENCOUNTER — Other Ambulatory Visit (HOSPITAL_COMMUNITY): Payer: Self-pay

## 2023-05-20 ENCOUNTER — Other Ambulatory Visit: Payer: Self-pay

## 2023-05-20 MED ORDER — BUPROPION HCL ER (XL) 150 MG PO TB24
150.0000 mg | ORAL_TABLET | Freq: Every day | ORAL | 0 refills | Status: DC
Start: 2023-05-20 — End: 2023-11-05
  Filled 2023-05-20: qty 90, 90d supply, fill #0

## 2023-05-27 ENCOUNTER — Encounter: Payer: Self-pay | Admitting: Family Medicine

## 2023-05-27 ENCOUNTER — Ambulatory Visit: Payer: 59 | Admitting: Family Medicine

## 2023-05-27 VITALS — BP 133/73 | HR 82 | Temp 98.5°F | Ht 73.0 in | Wt 333.0 lb

## 2023-05-27 DIAGNOSIS — I152 Hypertension secondary to endocrine disorders: Secondary | ICD-10-CM | POA: Diagnosis not present

## 2023-05-27 DIAGNOSIS — E1169 Type 2 diabetes mellitus with other specified complication: Secondary | ICD-10-CM | POA: Diagnosis not present

## 2023-05-27 DIAGNOSIS — F1010 Alcohol abuse, uncomplicated: Secondary | ICD-10-CM

## 2023-05-27 DIAGNOSIS — E1159 Type 2 diabetes mellitus with other circulatory complications: Secondary | ICD-10-CM | POA: Diagnosis not present

## 2023-05-27 DIAGNOSIS — Z720 Tobacco use: Secondary | ICD-10-CM | POA: Diagnosis not present

## 2023-05-27 DIAGNOSIS — E785 Hyperlipidemia, unspecified: Secondary | ICD-10-CM | POA: Diagnosis not present

## 2023-05-27 DIAGNOSIS — Z7984 Long term (current) use of oral hypoglycemic drugs: Secondary | ICD-10-CM

## 2023-05-27 DIAGNOSIS — E1165 Type 2 diabetes mellitus with hyperglycemia: Secondary | ICD-10-CM | POA: Diagnosis not present

## 2023-05-27 NOTE — Progress Notes (Signed)
Acute Office Visit  Subjective:  Patient ID: Casey Hoover, male    DOB: 03/02/1981, 42 y.o.   MRN: [id number]  Chief Complaint  Patient presents with   Medical Management of Chronic Issues    Hyperlipidemia Diabetes Patient is NPO Payient is taking crestor 40 mg and fenofibrate   HPI Patient is in today for  Hypertension Has BP monitor at home Yes BP at home average 130/70, sometimes in the 120s  ROS Denies anxiety, fatigue, peripheral edema, changes to vision, chest pain, headaches, palpitations, sweats, SOB, PND, orthopnea Meds none  CAD risks smoker, Diabetes Mellitus, hypertension, hypercholesterolemia/hyperlipidemia  Type 2 Diabetes with HTN, HLD Glucometer:Freestyle fingerstick  High at home: 220; Low at home: 100, Taking medication(s): metformin and ozempic  Last eye exam: due  Last foot exam: due Last A1c:  Lab Results  Component Value Date   HGBA1C 9.9 (H) 03/26/2023   Nephropathy screen indicated?: due  Last flu, zoster and/or pneumovax:  Immunization History  Administered Date(s) Administered   DTP 03/14/1988   DTaP 03/14/1988   Hepatitis B 08/07/1994, 09/20/1994, 02/05/1995   IPV 03/14/1988   Influenza,inj,Quad PF,6+ Mos 08/11/2017, 07/23/2018, 09/14/2019, 09/14/2019   Influenza,inj,Quad PF,6-35 Mos 08/11/2017   Influenza-Unspecified 09/14/2019   MMR 09/20/1994   OPV 03/14/1988   ROS: Denies dizziness, LOC, polyuria, polydipsia, unintended weight loss/gain, foot ulcerations, numbness or tingling in extremities, shortness of breath or chest pain.  Hyperlipidemia: Patient presents with hyperlipidemia. Reports negative symptoms. . There is not a family history of hyperlipidemia. There is a family history of early ischemia heart disease. Grandfather passed at 30s/40s  Tricor   Tobacco Use  Started at 42 years old  Smokes 1PPD   Alcohol Use  Reports that he has cut back on drinking. Only Drinks 3 times per week.  Only drinks beer, states he will  consume 12 at one time   ROS As per HPI  Objective:  BP 133/73   Pulse 82   Temp 98.5 F (36.9 C)   Ht 6\' 1"  (1.854 m)   Wt (!) 333 lb (151 kg)   SpO2 95%   BMI 43.93 kg/m    Physical Exam   The 10-year ASCVD risk score (Arnett DK, et al., 2019) is: 26.2%   Values used to calculate the score:     Age: 37 years     Sex: Male     Is Non-Hispanic African American: No     Diabetic: Yes     Tobacco smoker: Yes     Systolic Blood Pressure: 133 mmHg     Is BP treated: Yes     HDL Cholesterol: 24 mg/dL     Total Cholesterol: 213 mg/dL  Assessment & Plan:  1. Hypertension associated with diabetes (HCC) Improved today in visit. Will not increase ACE-I at this time. Patient to bring BP log to next visit for review of results.   2. Type 2 diabetes mellitus with hyperglycemia, without long-term current use of insulin (HCC) Not at goal with last A1C. Too early to collect A1C today due to coverage. Will collect labs as below in future. Will communicate results to patient once available. Will await results to determine next steps.  - Microalbumin / creatinine urine ratio; Future - Bayer DCA Hb A1c Waived; Future  3. Hyperlipidemia associated with type 2 diabetes mellitus (HCC) Not at goal at last lab. Patient will need to repeat labs. Unable to collect A1C today due to coverage. Will have patient return while  fasting to repeat all labs and collect microalbumin/crt evaluation. Tolerating crestor and tricor well.   4. Morbid obesity (HCC) Discussed with patient to continue healthy lifestyle choices, including diet (rich in fruits, vegetables, and lean proteins, and low in salt and simple carbohydrates) and exercise (at least 30 minutes of moderate physical activity daily). Limit beverages high is sugar. Recommended at least 80-100 oz of water daily.   5. Tobacco abuse Continues to smoke. Reviewed pack history. Not due for lung cancer screening at this time.  Pack Years Calculator from  [employer]  on 05/27/2023 ** All calculations should be rechecked by clinician prior to use **  RESULT SUMMARY: 480 pack years More pack years correlates with higher lung disease risk, including lung cancer; consider screening with low-dose CT in patients ?55 years with ?30 pack year history; counsel patients on smoking cessation INPUTS: Packs of cigarettes smoked per day --> 20 packs Years the patient has smoked --> 24 years  6. Alcohol abuse Praised patient for the reduction that he has completed. Disucssed with patient continuing to reduce intake and monitor for signs of cirrhosis. Patient aware of hepatic steatosis on [place]. Reviewed precautions with patient.   The above assessment and management plan was discussed with the patient. The patient verbalized understanding of and has agreed to the management plan using shared-decision making. Patient is aware to call the clinic if they develop any new symptoms or if symptoms fail to improve or worsen. Patient is aware when to return to the clinic for a follow-up visit. Patient educated on when it is appropriate to go to the emergency department.   No follow-ups on file.  Neale Burly, DNP-FNP [facility] [address] [place], [place] [address] [phone]

## 2023-07-07 ENCOUNTER — Telehealth: Payer: Self-pay | Admitting: Family Medicine

## 2023-07-09 ENCOUNTER — Other Ambulatory Visit: Payer: Self-pay

## 2023-07-09 ENCOUNTER — Other Ambulatory Visit: Payer: Self-pay | Admitting: Family Medicine

## 2023-07-09 DIAGNOSIS — E1165 Type 2 diabetes mellitus with hyperglycemia: Secondary | ICD-10-CM

## 2023-07-09 MED ORDER — FENOFIBRATE 48 MG PO TABS
48.0000 mg | ORAL_TABLET | Freq: Every day | ORAL | 0 refills | Status: DC
  Filled 2023-07-09: qty 90, 90d supply, fill #0

## 2023-07-09 MED ORDER — METFORMIN HCL ER 500 MG PO TB24
1000.0000 mg | ORAL_TABLET | Freq: Two times a day (BID) | ORAL | 0 refills | Status: DC
Start: 2023-07-09 — End: 2023-11-05
  Filled 2023-07-09: qty 360, 90d supply, fill #0

## 2023-07-09 MED ORDER — ROSUVASTATIN CALCIUM 40 MG PO TABS
40.0000 mg | ORAL_TABLET | Freq: Every day | ORAL | 0 refills | Status: DC
  Filled 2023-07-09: qty 90, 90d supply, fill #0

## 2023-07-10 ENCOUNTER — Other Ambulatory Visit: Payer: Self-pay

## 2023-07-21 ENCOUNTER — Other Ambulatory Visit (HOSPITAL_COMMUNITY): Payer: Self-pay

## 2023-07-29 ENCOUNTER — Ambulatory Visit: Payer: 59 | Admitting: Family Medicine

## 2023-07-30 ENCOUNTER — Encounter: Payer: Self-pay | Admitting: Family Medicine

## 2023-08-25 ENCOUNTER — Telehealth: Payer: Self-pay | Admitting: Family Medicine

## 2023-10-17 ENCOUNTER — Telehealth: Payer: 59 | Admitting: Nurse Practitioner

## 2023-10-17 DIAGNOSIS — J0140 Acute pansinusitis, unspecified: Secondary | ICD-10-CM | POA: Diagnosis not present

## 2023-10-17 DIAGNOSIS — R051 Acute cough: Secondary | ICD-10-CM

## 2023-10-17 MED ORDER — IPRATROPIUM BROMIDE 0.03 % NA SOLN
2.0000 | Freq: Two times a day (BID) | NASAL | 12 refills | Status: DC
Start: 2023-10-17 — End: 2023-12-24

## 2023-10-17 MED ORDER — AMOXICILLIN-POT CLAVULANATE 875-125 MG PO TABS
1.0000 | ORAL_TABLET | Freq: Two times a day (BID) | ORAL | 0 refills | Status: AC
Start: 2023-10-17 — End: 2023-10-24

## 2023-10-17 MED ORDER — PREDNISONE 20 MG PO TABS
20.0000 mg | ORAL_TABLET | Freq: Two times a day (BID) | ORAL | 0 refills | Status: AC
Start: 2023-10-17 — End: 2023-10-22

## 2023-10-17 MED ORDER — BENZONATATE 100 MG PO CAPS
100.0000 mg | ORAL_CAPSULE | Freq: Three times a day (TID) | ORAL | 0 refills | Status: DC | PRN
Start: 2023-10-17 — End: 2023-11-05

## 2023-10-17 NOTE — Progress Notes (Signed)
Virtual Visit Consent   Casey Hoover, you are scheduled for a virtual visit with a [name] provider today. Just as with appointments in the office, your consent must be obtained to participate. Your consent will be active for this visit and any virtual visit you may have with one of our providers in the next 365 days. If you have a MyChart account, a copy of this consent can be sent to you electronically.  As this is a virtual visit, video technology does not allow for your provider to perform a traditional examination. This may limit your provider's ability to fully assess your condition. If your provider identifies any concerns that need to be evaluated in person or the need to arrange testing (such as labs, EKG, etc.), we will make arrangements to do so. Although advances in technology are sophisticated, we cannot ensure that it will always work on either your end or our end. If the connection with a video visit is poor, the visit may have to be switched to a telephone visit. With either a video or telephone visit, we are not always able to ensure that we have a secure connection.  By engaging in this virtual visit, you consent to the provision of healthcare and authorize for your insurance to be billed (if applicable) for the services provided during this visit. Depending on your insurance coverage, you may receive a charge related to this service.  I need to obtain your verbal consent now. Are you willing to proceed with your visit today? Casey Hoover has provided verbal consent on 10/17/2023 for a virtual visit (video or telephone). Casey Simas, FNP  Date: 10/17/2023 4:22 PM  Virtual Visit via Video Note   I, Casey Hoover, connected with  Casey Hoover  ([id number], 1980/11/09) on 10/17/23 at  4:45 PM EST by a video-enabled telemedicine application and verified that I am speaking with the correct person using two identifiers.  Location: Patient: Virtual Visit Location  Patient: Home Provider: Virtual Visit Location Provider: Home Office   I discussed the limitations of evaluation and management by telemedicine and the availability of in person appointments. The patient expressed understanding and agreed to proceed.    History of Present Illness: Casey Hoover is a 42 y.o. who identifies as a male who was assigned male at birth, and is being seen today for congestion, cough and body aches. He has ear pain and feels it going to his chest as well   Symptom onset was last week   Has been using Mucinex without relief  Had a fever on day 1 not since that time   He denies a history of asthma      Problems:  Patient Active Problem List   Diagnosis Date Noted   Recurrent moderate major depressive disorder with anxiety (HCC) 12/29/2020   Diabetic nephropathy (HCC) 12/29/2020   Hyperlipidemia associated with type 2 diabetes mellitus (HCC) 12/29/2020   Alcohol abuse 12/29/2020   Type 2 diabetes mellitus with hyperglycemia, without long-term current use of insulin (HCC) 12/26/2020   Low testosterone 12/28/2015   Tobacco abuse 12/26/2015   Hypertension associated with diabetes (HCC) 07/11/2015   Morbid obesity (HCC) 07/11/2015   OSA (obstructive sleep apnea) 08/04/2014   Controlled gout 03/07/2014    Allergies:  Allergies  Allergen Reactions   Sulfa Antibiotics Rash   Medications:  Current Outpatient Medications:    budesonide ([employer] BUDESONIDE) 32 MCG/ACT nasal spray, Place 2 sprays into both nostrils daily. (Patient not taking:  Reported on 03/26/2023), Disp: 8.43 mL, Rfl: 0   buPROPion (WELLBUTRIN XL) 150 MG 24 hr tablet, Take 1 tablet (150 mg total) by mouth daily., Disp: 90 tablet, Rfl: 0   fenofibrate (TRICOR) 48 MG tablet, Take 1 tablet (48 mg total) by mouth daily., Disp: 90 tablet, Rfl: 0   glucose blood (FREESTYLE TEST STRIPS) test strip, Use as directed. (Patient not taking: Reported on 03/26/2023), Disp: 100 each, Rfl: 12   Lancets  (FREESTYLE) lancets, Use as directed., Disp: 100 each, Rfl: 12   lisinopril (ZESTRIL) 10 MG tablet, Take 1 tablet (10 mg total) by mouth daily., Disp: 90 tablet, Rfl: 1   metFORMIN (GLUCOPHAGE-XR) 500 MG 24 hr tablet, Take 2 tablets (1,000 mg total) by mouth in the morning and at bedtime., Disp: 360 tablet, Rfl: 0   rosuvastatin (CRESTOR) 40 MG tablet, Take 1 tablet (40 mg total) by mouth daily., Disp: 90 tablet, Rfl: 0   Semaglutide, 1 MG/DOSE, 4 MG/3ML SOPN, Inject 1 mg as directed once a week., Disp: 3 mL, Rfl: 0   Semaglutide,0.25 or 0.5MG /DOS, 2 MG/3ML SOPN, Inject 0.25 mg into the skin once a week., Disp: 3 mL, Rfl: 0   Semaglutide,0.25 or 0.5MG /DOS, 2 MG/3ML SOPN, Inject 0.5 mg into the skin once a week., Disp: 3 mL, Rfl: 0  Observations/Objective: Patient is well-developed, well-nourished in no acute distress.  Resting comfortably  at home.  Head is normocephalic, atraumatic.  No labored breathing.  Speech is clear and coherent with logical content.  Patient is alert and oriented at baseline.    Assessment and Plan:  1. Acute non-recurrent pansinusitis (Primary)  - amoxicillin-clavulanate (AUGMENTIN) 875-125 MG tablet; Take 1 tablet by mouth 2 (two) times daily for 7 days.  Dispense: 14 tablet; Refill: 0 - ipratropium (ATROVENT) 0.03 % nasal spray; Place 2 sprays into both nostrils every 12 (twelve) hours.  Dispense: 30 mL; Refill: 12  2. Acute cough  - benzonatate (TESSALON) 100 MG capsule; Take 1 capsule (100 mg total) by mouth 3 (three) times daily as needed.  Dispense: 30 capsule; Refill: 0 - predniSONE (DELTASONE) 20 MG tablet; Take 1 tablet (20 mg total) by mouth 2 (two) times daily with a meal for 5 days.  Dispense: 10 tablet; Refill: 0      Follow Up Instructions: I discussed the assessment and treatment plan with the patient. The patient was provided an opportunity to ask questions and all were answered. The patient agreed with the plan and demonstrated an  understanding of the instructions.  A copy of instructions were sent to the patient via MyChart unless otherwise noted below.    The patient was advised to call back or seek an in-person evaluation if the symptoms worsen or if the condition fails to improve as anticipated.    Casey Simas, FNP

## 2023-10-30 ENCOUNTER — Ambulatory Visit: Payer: 59 | Admitting: Family Medicine

## 2023-11-05 ENCOUNTER — Ambulatory Visit (INDEPENDENT_AMBULATORY_CARE_PROVIDER_SITE_OTHER): Payer: Commercial Managed Care - PPO | Admitting: Family Medicine

## 2023-11-05 ENCOUNTER — Encounter: Payer: Self-pay | Admitting: Family Medicine

## 2023-11-05 ENCOUNTER — Ambulatory Visit (INDEPENDENT_AMBULATORY_CARE_PROVIDER_SITE_OTHER): Payer: Commercial Managed Care - PPO

## 2023-11-05 VITALS — BP 122/77 | HR 91 | Temp 98.3°F | Ht 73.0 in | Wt 328.0 lb

## 2023-11-05 DIAGNOSIS — Z720 Tobacco use: Secondary | ICD-10-CM

## 2023-11-05 DIAGNOSIS — E1159 Type 2 diabetes mellitus with other circulatory complications: Secondary | ICD-10-CM

## 2023-11-05 DIAGNOSIS — R062 Wheezing: Secondary | ICD-10-CM

## 2023-11-05 DIAGNOSIS — Z7985 Long-term (current) use of injectable non-insulin antidiabetic drugs: Secondary | ICD-10-CM

## 2023-11-05 DIAGNOSIS — I152 Hypertension secondary to endocrine disorders: Secondary | ICD-10-CM

## 2023-11-05 DIAGNOSIS — E1165 Type 2 diabetes mellitus with hyperglycemia: Secondary | ICD-10-CM

## 2023-11-05 DIAGNOSIS — G4733 Obstructive sleep apnea (adult) (pediatric): Secondary | ICD-10-CM

## 2023-11-05 DIAGNOSIS — E785 Hyperlipidemia, unspecified: Secondary | ICD-10-CM | POA: Diagnosis not present

## 2023-11-05 DIAGNOSIS — F419 Anxiety disorder, unspecified: Secondary | ICD-10-CM

## 2023-11-05 DIAGNOSIS — E1169 Type 2 diabetes mellitus with other specified complication: Secondary | ICD-10-CM

## 2023-11-05 DIAGNOSIS — F1010 Alcohol abuse, uncomplicated: Secondary | ICD-10-CM

## 2023-11-05 DIAGNOSIS — E1129 Type 2 diabetes mellitus with other diabetic kidney complication: Secondary | ICD-10-CM

## 2023-11-05 DIAGNOSIS — E1121 Type 2 diabetes mellitus with diabetic nephropathy: Secondary | ICD-10-CM

## 2023-11-05 DIAGNOSIS — F331 Major depressive disorder, recurrent, moderate: Secondary | ICD-10-CM

## 2023-11-05 LAB — BAYER DCA HB A1C WAIVED: HB A1C (BAYER DCA - WAIVED): 7.3 % — ABNORMAL HIGH (ref 4.8–5.6)

## 2023-11-05 MED ORDER — FENOFIBRATE 48 MG PO TABS
48.0000 mg | ORAL_TABLET | Freq: Every day | ORAL | 0 refills | Status: DC
Start: 2023-11-05 — End: 2023-11-06

## 2023-11-05 MED ORDER — SEMAGLUTIDE(0.25 OR 0.5MG/DOS) 2 MG/3ML ~~LOC~~ SOPN
0.5000 mg | PEN_INJECTOR | SUBCUTANEOUS | 0 refills | Status: DC
Start: 2023-12-05 — End: 2023-12-27

## 2023-11-05 MED ORDER — ROSUVASTATIN CALCIUM 40 MG PO TABS
40.0000 mg | ORAL_TABLET | Freq: Every day | ORAL | 0 refills | Status: DC
Start: 2023-11-05 — End: 2023-11-06

## 2023-11-05 MED ORDER — LISINOPRIL 10 MG PO TABS: 10.0000 mg | ORAL_TABLET | Freq: Every day | ORAL | 1 refills | Status: DC

## 2023-11-05 MED ORDER — SEMAGLUTIDE (1 MG/DOSE) 4 MG/3ML ~~LOC~~ SOPN
1.0000 mg | PEN_INJECTOR | SUBCUTANEOUS | 0 refills | Status: DC
Start: 2023-12-27 — End: 2024-01-18

## 2023-11-05 MED ORDER — ALBUTEROL SULFATE HFA 108 (90 BASE) MCG/ACT IN AERS
2.0000 | INHALATION_SPRAY | Freq: Four times a day (QID) | RESPIRATORY_TRACT | 0 refills | Status: DC | PRN
Start: 2023-11-05 — End: 2024-02-10

## 2023-11-05 MED ORDER — PREDNISONE 20 MG PO TABS
40.0000 mg | ORAL_TABLET | Freq: Every day | ORAL | 0 refills | Status: AC
Start: 2023-11-05 — End: 2023-11-10

## 2023-11-05 MED ORDER — SEMAGLUTIDE(0.25 OR 0.5MG/DOS) 2 MG/3ML ~~LOC~~ SOPN
0.2500 mg | PEN_INJECTOR | SUBCUTANEOUS | 0 refills | Status: DC
Start: 2023-11-05 — End: 2023-11-27

## 2023-11-05 NOTE — Progress Notes (Signed)
 Subjective:  Patient ID: Casey Hoover, male    DOB: August 07, 1981, 43 y.o.   MRN: [id number]  Patient Care Team: Chrystine Crate, FNP as PCP - General (Family Medicine)   Chief Complaint:  Medical Management of Chronic Issues   HPI: Casey Hoover is a 43 y.o. male presenting on 11/05/2023 for Medical Management of Chronic Issues  HPI 1. Type 2 diabetes mellitus with hyperglycemia, without long-term current use of insulin (HCC) Glucometer: has not been checking it at home.  Taking medication(s): metformin , states that he is out of ozempic  for 3-4 weeks. He would like to get off of metformin  and only take ozempic . States that he has diarrhea often.   Last eye exam: due - machine not working in office today  Last foot exam: due  Last A1c:  Lab Results  Component Value Date   HGBA1C 7.3 (H) 11/05/2023   Nephropathy screen indicated?: completed today.  Last flu, zoster and/or pneumovax:  Immunization History  Administered Date(s) Administered   DTP 03/14/1988   DTaP 03/14/1988   Hepatitis B 08/07/1994, 09/20/1994, 02/05/1995   IPV 03/14/1988   Influenza,inj,Quad PF,6+ Mos 08/11/2017, 07/23/2018, 09/14/2019, 09/14/2019   Influenza,inj,Quad PF,6-35 Mos 08/11/2017   Influenza-Unspecified 09/14/2019   MMR 09/20/1994   OPV 03/14/1988    ROS: Denies dizziness, LOC, polyuria, polydipsia, unintended weight loss/gain, foot ulcerations, numbness or tingling in extremities, shortness of breath or chest pain. States that he is losing weight. Believes it is due to oxempic.   2. Hyperlipidemia associated with type 2 diabetes mellitus (HCC) Lipid/Cholesterol, Follow-up  Last lipid panel Other pertinent labs  Lab Results  Component Value Date   CHOL 213 (H) 03/26/2023   HDL 24 (L) 03/26/2023   LDLCALC 72 03/26/2023   TRIG 743 (HH) 03/26/2023   CHOLHDL 8.9 (H) 03/26/2023   Lab Results  Component Value Date   ALT 54 (H) 03/26/2023   AST 29 03/26/2023   PLT 217  03/26/2023   TSH 1.820 07/21/2018     He was last seen for this 6 months ago.  Management since that visit includes crestor  40 mg .  He reports good compliance with treatment. He is not having side effects.   Symptoms: No chest pain No chest pressure/discomfort  No dyspnea No lower extremity edema  No numbness or tingling of extremity No orthopnea  No palpitations No paroxysmal nocturnal dyspnea  No speech difficulty No syncope    The 10-year ASCVD risk score (Arnett DK, et al., 2019) is: 22.9%  ---------------------------------------------------------------------------------------------------   3. Hypertension associated with diabetes (HCC) Has BP monitor at home No BP at home average not checking  ROS Denies  anxiety, fatigue, peripheral edema, changes to vision, chest pain, headaches, palpitations, sweats, SOB, PND, orthopnea Meds Lisinopril   CAD risks hypertension, hypercholesterolemia/hyperlipidemia   4. OSA (obstructive sleep apnea) States that he is not using CPAP. States that he has not used it in "forever" Reports that he is claustrophic and it does not work for him. States that he is worried about   5. Morbid obesity (HCC) States that he is not eating as much and trying to cut back on protoins. Switched to diet drinks instead of regular.   6. Tobacco abuse 1PPD  Started smoking at 43 years old  States that sometimes he consider quitting   7. Alcohol abuse Drinks on weekends only  12 pack per weekend    8. Cough  States that he has cough and congestion. Started in  December. States that he completed round of antibiotics right after Christmas. He was seen by virtual visit on 10/17/23. He completed Augmentin , prednisone  burst and tessalon  perles. States that he tried Tessalon  perles and they did not work. He is trying mucinex  and nasal spray. States that he used to have a fever, but that has resolved. States that he has inhaler for trouble breathing, just started  using it yesterday. He states that he had it from past experiences, this was not a new prescription.    Relevant past medical, surgical, family, and social history reviewed and updated as indicated.  Allergies and medications reviewed and updated. Data reviewed: Chart in Epic.   Past Medical History:  Diagnosis Date   Diabetes mellitus without complication (HCC)    Diabetic nephropathy (HCC) 12/29/2020   Gout    HTN (hypertension)    Hyperlipidemia 12/29/2020   Low testosterone     OSA (obstructive sleep apnea) 08/04/2014    Past Surgical History:  Procedure Laterality Date   CHOLECYSTECTOMY     WISDOM TOOTH EXTRACTION  2010    Social History   Socioeconomic History   Marital status: Married    Spouse name: Not on file   Number of children: Not on file   Years of education: Not on file   Highest education level: 12th grade  Occupational History   Occupation: Grading    Comment: Psychologist, sport and exercise  Tobacco Use   Smoking status: Every Day    Current packs/day: 1.00    Average packs/day: 1 pack/day for 15.0 years (15.0 ttl pk-yrs)    Types: Cigarettes   Smokeless tobacco: Never  Vaping Use   Vaping status: Never Used  Substance and Sexual Activity   Alcohol use: Yes    Alcohol/week: 15.0 standard drinks of alcohol    Types: 15 Cans of beer per week    Comment: Updated 12/26/20   Drug use: No   Sexual activity: Not on file  Other Topics Concern   Not on file  Social History Narrative   Not on file   Social Drivers of Health   Financial Resource Strain: Low Risk  (03/25/2023)   Overall Financial Resource Strain (CARDIA)    Difficulty of Paying Living Expenses: Not hard at all  Food Insecurity: No Food Insecurity (03/25/2023)   Hunger Vital Sign    Worried About Running Out of Food in the Last Year: Never true    Ran Out of Food in the Last Year: Never true  Transportation Needs: No Transportation Needs (03/25/2023)   PRAPARE - Administrator, Civil Service  (Medical): No    Lack of Transportation (Non-Medical): No  Physical Activity: Unknown (03/25/2023)   Exercise Vital Sign    Days of Exercise per Week: Patient declined    Minutes of Exercise per Session: Not on file  Stress: Patient Declined (03/25/2023)   [employer] of Occupational Health - Occupational Stress Questionnaire    Feeling of Stress : Patient declined  Social Connections: Moderately Integrated (03/25/2023)   Social Connection and Isolation Panel [NHANES]    Frequency of Communication with Friends and Family: More than three times a week    Frequency of Social Gatherings with Friends and Family: More than three times a week    Attends Religious Services: More than 4 times per year    Active Member of [employer] or Organizations: No    Attends Engineer, structural: Not on file    Marital Status: Married  Intimate  Partner Violence: Unknown (01/23/2022)   [facility]   HITS    Physically Hurt: Not on file    Insult or Talk Down To: Not on file    Threaten Physical Harm: Not on file    Scream or Curse: Not on file    Outpatient Encounter Medications as of 11/05/2023  Medication Sig   benzonatate  (TESSALON ) 100 MG capsule Take 1 capsule (100 mg total) by mouth 3 (three) times daily as needed.   budesonide  ([facility] BUDESONIDE ) 32 MCG/ACT nasal spray Place 2 sprays into both nostrils daily.   buPROPion  (WELLBUTRIN  XL) 150 MG 24 hr tablet Take 1 tablet (150 mg total) by mouth daily.   fenofibrate  (TRICOR ) 48 MG tablet Take 1 tablet (48 mg total) by mouth daily.   glucose blood (FREESTYLE TEST STRIPS) test strip Use as directed.   ipratropium (ATROVENT ) 0.03 % nasal spray Place 2 sprays into both nostrils every 12 (twelve) hours.   Lancets (FREESTYLE) lancets Use as directed.   lisinopril  (ZESTRIL ) 10 MG tablet Take 1 tablet (10 mg total) by mouth daily.   metFORMIN  (GLUCOPHAGE -XR) 500 MG 24 hr tablet Take 2 tablets (1,000 mg total) by mouth in the  morning and at bedtime.   rosuvastatin  (CRESTOR ) 40 MG tablet Take 1 tablet (40 mg total) by mouth daily.   Semaglutide , 1 MG/DOSE, 4 MG/3ML SOPN Inject 1 mg as directed once a week.   Semaglutide ,0.25 or 0.5MG /DOS, 2 MG/3ML SOPN Inject 0.25 mg into the skin once a week.   Semaglutide ,0.25 or 0.5MG /DOS, 2 MG/3ML SOPN Inject 0.5 mg into the skin once a week.   No facility-administered encounter medications on file as of 11/05/2023.    Allergies  Allergen Reactions   Sulfa Antibiotics Rash   Review of Systems As per HPI  Objective:  BP 122/77   Pulse 91   Temp 98.3 F (36.8 C)   Ht 6\' 1"  (1.854 m)   Wt (!) 328 lb (148.8 kg)   SpO2 96%   BMI 43.27 kg/m    Wt Readings from Last 3 Encounters:  11/05/23 (!) 328 lb (148.8 kg)  05/27/23 (!) 333 lb (151 kg)  03/26/23 (!) 338 lb 12.8 oz (153.7 kg)    Physical Exam Constitutional:      General: He is awake. He is not in acute distress.    Appearance: Normal appearance. He is well-developed and well-groomed. He is morbidly obese. He is not ill-appearing, toxic-appearing or diaphoretic.  HENT:     Head:     Comments: Hoarse voice     Nose: Congestion and rhinorrhea present. Rhinorrhea is clear.     Right Sinus: Maxillary sinus tenderness and frontal sinus tenderness present.     Left Sinus: Maxillary sinus tenderness and frontal sinus tenderness present.     Mouth/Throat:     Lips: Pink. No lesions.     Tongue: No lesions.     Pharynx: No pharyngeal swelling, posterior oropharyngeal erythema or postnasal drip.  Cardiovascular:     Rate and Rhythm: Normal rate and regular rhythm.     Pulses: Normal pulses.          Radial pulses are 2+ on the right side and 2+ on the left side.       Posterior tibial pulses are 2+ on the right side and 2+ on the left side.     Heart sounds: Normal heart sounds. No murmur heard.    No gallop.  Pulmonary:  Effort: Pulmonary effort is normal. No respiratory distress.     Breath sounds: No  stridor. Examination of the right-upper field reveals wheezing. Examination of the left-upper field reveals wheezing. Examination of the right-middle field reveals wheezing. Examination of the left-middle field reveals wheezing. Examination of the right-lower field reveals wheezing. Examination of the left-lower field reveals wheezing. Wheezing present. No rhonchi or rales.  Musculoskeletal:     Cervical back: Full passive range of motion without pain and neck supple.     Right lower leg: No edema.     Left lower leg: No edema.  Lymphadenopathy:     Head:     Right side of head: No submental, submandibular, tonsillar, preauricular or posterior auricular adenopathy.     Left side of head: No submental, submandibular, tonsillar, preauricular or posterior auricular adenopathy.  Skin:    General: Skin is warm.     Capillary Refill: Capillary refill takes less than 2 seconds.  Neurological:     General: No focal deficit present.     Mental Status: He is alert, oriented to person, place, and time and easily aroused. Mental status is at baseline.     GCS: GCS eye subscore is 4. GCS verbal subscore is 5. GCS motor subscore is 6.     Motor: No weakness.  Psychiatric:        Attention and Perception: Attention and perception normal.        Mood and Affect: Mood and affect normal.        Speech: Speech normal.        Behavior: Behavior normal. Behavior is cooperative.        Thought Content: Thought content normal. Thought content does not include homicidal or suicidal ideation. Thought content does not include homicidal or suicidal plan.        Cognition and Memory: Cognition and memory normal.        Judgment: Judgment normal.    Results for orders placed or performed in visit on 03/26/23  Bayer DCA Hb A1c Waived   Collection Time: 03/26/23  8:28 AM  Result Value Ref Range   HB A1C (BAYER DCA - WAIVED) 9.9 (H) 4.8 - 5.6 %  CBC with Differential/Platelet   Collection Time: 03/26/23  8:31 AM   Result Value Ref Range   WBC 10.7 3.4 - 10.8 x10E3/uL   RBC 5.51 4.14 - 5.80 x10E6/uL   Hemoglobin 16.7 13.0 - 17.7 g/dL   Hematocrit 01.0 27.2 - 51.0 %   MCV 89 79 - 97 fL   MCH 30.3 26.6 - 33.0 pg   MCHC 33.9 31.5 - 35.7 g/dL   RDW 53.6 64.4 - 03.4 %   Platelets 217 150 - 450 x10E3/uL   Neutrophils 67 Not Estab. %   Lymphs 23 Not Estab. %   Monocytes 6 Not Estab. %   Eos 2 Not Estab. %   Basos 1 Not Estab. %   Neutrophils Absolute 7.1 (H) 1.4 - 7.0 x10E3/uL   Lymphocytes Absolute 2.5 0.7 - 3.1 x10E3/uL   Monocytes Absolute 0.7 0.1 - 0.9 x10E3/uL   EOS (ABSOLUTE) 0.2 0.0 - 0.4 x10E3/uL   Basophils Absolute 0.1 0.0 - 0.2 x10E3/uL   Immature Granulocytes 1 Not Estab. %   Immature Grans (Abs) 0.1 0.0 - 0.1 x10E3/uL  CMP14+EGFR   Collection Time: 03/26/23  8:31 AM  Result Value Ref Range   Glucose 224 (H) 70 - 99 mg/dL   BUN 10 6 - 20 mg/dL  Creatinine, Ser 1.00 0.76 - 1.27 mg/dL   eGFR 98 >19 JY/NWG/9.56   BUN/Creatinine Ratio 10 9 - 20   Sodium 137 134 - 144 mmol/L   Potassium 4.9 3.5 - 5.2 mmol/L   Chloride 99 96 - 106 mmol/L   CO2 23 20 - 29 mmol/L   Calcium  9.8 8.7 - 10.2 mg/dL   Total Protein 7.2 6.0 - 8.5 g/dL   Albumin 4.6 4.1 - 5.1 g/dL   Globulin, Total 2.6 1.5 - 4.5 g/dL   Albumin/Globulin Ratio 1.8 1.2 - 2.2   Bilirubin Total 0.4 0.0 - 1.2 mg/dL   Alkaline Phosphatase 111 44 - 121 IU/L   AST 29 0 - 40 IU/L   ALT 54 (H) 0 - 44 IU/L  Lipid panel   Collection Time: 03/26/23  8:31 AM  Result Value Ref Range   Cholesterol, Total 213 (H) 100 - 199 mg/dL   Triglycerides 213 (HH) 0 - 149 mg/dL   HDL 24 (L) >08 mg/dL   VLDL Cholesterol Cal 117 (H) 5 - 40 mg/dL   LDL Chol Calc (NIH) 72 0 - 99 mg/dL   Chol/HDL Ratio 8.9 (H) 0.0 - 5.0 ratio       11/05/2023   11:10 AM 05/27/2023    9:09 AM 03/26/2023    8:40 AM 07/03/2022    9:59 AM 06/19/2022    9:18 AM  Depression screen PHQ 2/9  Decreased Interest 0 0 1 0 0  Down, Depressed, Hopeless 0 0 0 0 0  PHQ - 2  Score 0 0 1 0 0  Altered sleeping  0 1    Tired, decreased energy  0 3    Change in appetite  0 0    Feeling bad or failure about yourself   0 0    Trouble concentrating  0 1    Moving slowly or fidgety/restless  0 0    Suicidal thoughts  0 0    PHQ-9 Score  0 6    Difficult doing work/chores  Not difficult at all Somewhat difficult         11/05/2023   11:11 AM 05/27/2023    9:10 AM 03/26/2023    8:41 AM 06/19/2022    9:18 AM  GAD 7 : Generalized Anxiety Score  Nervous, Anxious, on Edge 1 0 1 0  Control/stop worrying 1 0 1 0  Worry too much - different things 1 0 1 0  Trouble relaxing 1 0 1 0  Restless 1 0 1 0  Easily annoyed or irritable 1 0 1 0  Afraid - awful might happen 0 0 0 0  Total GAD 7 Score 6 0 6 0  Anxiety Difficulty Not difficult at all Not difficult at all Not difficult at all Not difficult at all   Pertinent labs & imaging results that were available during my care of the patient were reviewed by me and considered in my medical decision making.  Assessment & Plan:  Casey "Micheal" was seen today for medical management of chronic issues.  Diagnoses and all orders for this visit:  Type 2 diabetes mellitus with hyperglycemia, without long-term current use of insulin (HCC) Above goal. Reviewed A1C in office with patient. Will stop metformin  due to side effects. Will continue ozempic . Will restart at lower dose due to lack of injections for 3-4 weeks. Will collect microalbumin as below. Explained to patient that I would like for him to stay on Lisinopril  as it is helping to  protect his kidneys as well. Can consider a referral to Nephrology if not improved. Encouraged pt to continue to make diet and lifestyle changes.  -     Microalbumin / creatinine urine ratio -     Bayer DCA Hb A1c Waived -     Semaglutide ,0.25 or 0.5MG /DOS, 2 MG/3ML SOPN; Inject 0.25 mg into the skin once a week for 4 doses. -     Semaglutide , 1 MG/DOSE, 4 MG/3ML SOPN; Inject 1 mg as directed once a  week for 4 doses. -     Semaglutide ,0.25 or 0.5MG /DOS, 2 MG/3ML SOPN; Inject 0.5 mg into the skin once a week for 4 doses.  Hyperlipidemia associated with type 2 diabetes mellitus (HCC) Labs as below. Will communicate results to patient once available. Will await results to determine next steps.  Unknown fasting status.  Encouraged improved compliance with medications below. Given patient ASCVD risk score, he is at elevated risk of cardiac event in the next 10 years.  -     CMP14+EGFR -     Lipid panel -     rosuvastatin  (CRESTOR ) 40 MG tablet; Take 1 tablet (40 mg total) by mouth daily. -     fenofibrate  (TRICOR ) 48 MG tablet; Take 1 tablet (48 mg total) by mouth daily.  Hypertension associated with diabetes (HCC) Well controlled. Continue current regimen.  -     lisinopril  (ZESTRIL ) 10 MG tablet; Take 1 tablet (10 mg total) by mouth daily.  OSA (obstructive sleep apnea) Patient is not using therapy. Will refer to sleep studies for patient to have evaluation for Inspire or other devices.  -     Ambulatory referral to Sleep Studies  Diabetic nephropathy associated with type 2 diabetes mellitus (HCC) Will complete foot exam at next appt.  -     lisinopril  (ZESTRIL ) 10 MG tablet; Take 1 tablet (10 mg total) by mouth daily.  Morbid obesity (HCC) Praised patent on the diet and lifestyle changes that he is making. Resume semaglutide  as below.  -     Semaglutide ,0.25 or 0.5MG /DOS, 2 MG/3ML SOPN; Inject 0.25 mg into the skin once a week for 4 doses. -     Semaglutide , 1 MG/DOSE, 4 MG/3ML SOPN; Inject 1 mg as directed once a week for 4 doses. -     Semaglutide ,0.25 or 0.5MG /DOS, 2 MG/3ML SOPN; Inject 0.5 mg into the skin once a week for 4 doses.  Tobacco abuse Encouraged patient to continue to try to reduce intake and consider quitting. Will continue to monitor.   Alcohol abuse Encouraged patient to continue to try to reduce intake and consider quitting. Will continue to monitor.    Wheezing Imaging as below. Will communicate results to patient once available. Will await results to determine next steps.  Sent in medications as below for continued wheezing.  -     DG Chest 2 View; Future -     predniSONE  (DELTASONE ) 20 MG tablet; Take 2 tablets (40 mg total) by mouth daily with breakfast for 5 days. -     albuterol  (VENTOLIN  HFA) 108 (90 Base) MCG/ACT inhaler; Inhale 2 puffs into the lungs every 6 (six) hours as needed for wheezing or shortness of breath.  Long-term current use of injectable noninsulin antidiabetic medication -     Semaglutide ,0.25 or 0.5MG /DOS, 2 MG/3ML SOPN; Inject 0.25 mg into the skin once a week for 4 doses. -     Semaglutide , 1 MG/DOSE, 4 MG/3ML SOPN; Inject 1 mg as directed once a week  for 4 doses. -     Semaglutide ,0.25 or 0.5MG /DOS, 2 MG/3ML SOPN; Inject 0.5 mg into the skin once a week for 4 doses.  Recurrent moderate major depressive disorder with anxiety (HCC) Denies SI. Symptoms stable. Reports that anxiety worsened with Wellbutrin . Does not wish to start medication at this time. Does not wish to seek counseling.   Continue all other maintenance medications.  Follow up plan: Return in about 3 months (around 02/03/2024) for Chronic Condition Follow up.   Continue healthy lifestyle choices, including diet (rich in fruits, vegetables, and lean proteins, and low in salt and simple carbohydrates) and exercise (at least 30 minutes of moderate physical activity daily).  Written and verbal instructions provided   The above assessment and management plan was discussed with the patient. The patient verbalized understanding of and has agreed to the management plan. Patient is aware to call the clinic if they develop any new symptoms or if symptoms persist or worsen. Patient is aware when to return to the clinic for a follow-up visit. Patient educated on when it is appropriate to go to the emergency department.   Jacqualyn Mates, DNP-FNP [facility] [address] [place], [place] [address] [phone]

## 2023-11-06 ENCOUNTER — Other Ambulatory Visit: Payer: Self-pay | Admitting: Family Medicine

## 2023-11-06 ENCOUNTER — Encounter: Payer: Self-pay | Admitting: Family Medicine

## 2023-11-06 ENCOUNTER — Other Ambulatory Visit (HOSPITAL_COMMUNITY): Payer: Self-pay

## 2023-11-06 ENCOUNTER — Other Ambulatory Visit: Payer: Self-pay

## 2023-11-06 DIAGNOSIS — E1165 Type 2 diabetes mellitus with hyperglycemia: Secondary | ICD-10-CM

## 2023-11-06 DIAGNOSIS — E1121 Type 2 diabetes mellitus with diabetic nephropathy: Secondary | ICD-10-CM

## 2023-11-06 DIAGNOSIS — E1169 Type 2 diabetes mellitus with other specified complication: Secondary | ICD-10-CM

## 2023-11-06 DIAGNOSIS — Z7985 Long-term (current) use of injectable non-insulin antidiabetic drugs: Secondary | ICD-10-CM

## 2023-11-06 DIAGNOSIS — E1159 Type 2 diabetes mellitus with other circulatory complications: Secondary | ICD-10-CM

## 2023-11-06 LAB — CMP14+EGFR
ALT: 42 [IU]/L (ref 0–44)
AST: 25 [IU]/L (ref 0–40)
Albumin: 4.6 g/dL (ref 4.1–5.1)
Alkaline Phosphatase: 112 [IU]/L (ref 44–121)
BUN/Creatinine Ratio: 10 (ref 9–20)
BUN: 10 mg/dL (ref 6–24)
Bilirubin Total: 0.7 mg/dL (ref 0.0–1.2)
CO2: 21 mmol/L (ref 20–29)
Calcium: 9.6 mg/dL (ref 8.7–10.2)
Chloride: 100 mmol/L (ref 96–106)
Creatinine, Ser: 1.01 mg/dL (ref 0.76–1.27)
Globulin, Total: 2.8 g/dL (ref 1.5–4.5)
Glucose: 246 mg/dL — ABNORMAL HIGH (ref 70–99)
Potassium: 4.8 mmol/L (ref 3.5–5.2)
Sodium: 139 mmol/L (ref 134–144)
Total Protein: 7.4 g/dL (ref 6.0–8.5)
eGFR: 96 mL/min/{1.73_m2} (ref >59)

## 2023-11-06 LAB — LIPID PANEL
Chol/HDL Ratio: 6.8 {ratio} — ABNORMAL HIGH (ref 0.0–5.0)
Cholesterol, Total: 183 mg/dL (ref 100–199)
HDL: 27 mg/dL — ABNORMAL LOW (ref >39)
LDL Chol Calc (NIH): 97 mg/dL (ref 0–99)
Triglycerides: 349 mg/dL — ABNORMAL HIGH (ref 0–149)
VLDL Cholesterol Cal: 59 mg/dL — ABNORMAL HIGH (ref 5–40)

## 2023-11-06 MED ORDER — ROSUVASTATIN CALCIUM 40 MG PO TABS
40.0000 mg | ORAL_TABLET | Freq: Every day | ORAL | 1 refills | Status: DC
  Filled 2023-11-06 – 2023-12-19 (×2): qty 90, 90d supply, fill #0
  Filled 2024-04-25: qty 90, 90d supply, fill #1

## 2023-11-06 MED ORDER — SEMAGLUTIDE(0.25 OR 0.5MG/DOS) 2 MG/3ML ~~LOC~~ SOPN
0.5000 mg | PEN_INJECTOR | SUBCUTANEOUS | 0 refills | Status: AC
Start: 2023-12-05 — End: 2024-01-19
  Filled 2023-11-06 – 2023-12-19 (×3): qty 3, 28d supply, fill #0

## 2023-11-06 MED ORDER — LISINOPRIL 10 MG PO TABS
10.0000 mg | ORAL_TABLET | Freq: Every day | ORAL | 1 refills | Status: DC
  Filled 2023-11-06 – 2023-12-19 (×2): qty 90, 90d supply, fill #0
  Filled 2024-04-25: qty 90, 90d supply, fill #1

## 2023-11-06 MED ORDER — FENOFIBRATE 48 MG PO TABS
48.0000 mg | ORAL_TABLET | Freq: Every day | ORAL | 1 refills | Status: DC
Start: 2023-11-06 — End: 2023-11-11
  Filled 2023-11-06: qty 90, 90d supply, fill #0

## 2023-11-06 MED ORDER — SEMAGLUTIDE (1 MG/DOSE) 4 MG/3ML ~~LOC~~ SOPN
1.0000 mg | PEN_INJECTOR | SUBCUTANEOUS | 0 refills | Status: AC
Start: 2023-12-27 — End: 2024-01-18
  Filled 2023-11-06 (×2): qty 3, 28d supply, fill #0

## 2023-11-06 MED ORDER — SEMAGLUTIDE(0.25 OR 0.5MG/DOS) 2 MG/3ML ~~LOC~~ SOPN
0.2500 mg | PEN_INJECTOR | SUBCUTANEOUS | 0 refills | Status: AC
Start: 2023-11-06 — End: 2023-11-28
  Filled 2023-11-06: qty 3, 30d supply, fill #0

## 2023-11-07 ENCOUNTER — Encounter: Payer: Self-pay | Admitting: Family Medicine

## 2023-11-07 DIAGNOSIS — Z7985 Long-term (current) use of injectable non-insulin antidiabetic drugs: Secondary | ICD-10-CM | POA: Insufficient documentation

## 2023-11-07 DIAGNOSIS — E1129 Type 2 diabetes mellitus with other diabetic kidney complication: Secondary | ICD-10-CM | POA: Insufficient documentation

## 2023-11-07 LAB — MICROALBUMIN / CREATININE URINE RATIO
Creatinine, Urine: 168.2 mg/dL
Microalb/Creat Ratio: 296 mg/g{creat} — ABNORMAL HIGH (ref 0–29)
Microalbumin, Urine: 497.9 ug/mL

## 2023-11-07 NOTE — Addendum Note (Signed)
Addended by: Neale Burly on: 11/07/2023 01:59 PM   Modules accepted: Orders

## 2023-11-11 ENCOUNTER — Encounter: Payer: Self-pay | Admitting: Family Medicine

## 2023-11-11 ENCOUNTER — Other Ambulatory Visit: Payer: Self-pay | Admitting: *Deleted

## 2023-11-11 ENCOUNTER — Other Ambulatory Visit (HOSPITAL_COMMUNITY): Payer: Self-pay

## 2023-11-11 MED ORDER — FENOFIBRATE 145 MG PO TABS: 145.0000 mg | ORAL_TABLET | Freq: Every day | ORAL | 5 refills | Status: DC

## 2023-11-11 MED ORDER — FENOFIBRATE 145 MG PO TABS
145.0000 mg | ORAL_TABLET | Freq: Every day | ORAL | 3 refills | Status: DC
  Filled 2023-11-11 – 2023-12-19 (×2): qty 90, 90d supply, fill #0
  Filled 2024-04-25: qty 90, 90d supply, fill #1
  Filled 2024-08-09: qty 90, 90d supply, fill #2

## 2023-11-11 NOTE — Addendum Note (Signed)
Addended by: Neale Burly on: 11/11/2023 07:54 AM   Modules accepted: Orders

## 2023-11-11 NOTE — Progress Notes (Signed)
No active disease. Recommend patient follow up if symptoms continue.

## 2023-11-11 NOTE — Progress Notes (Signed)
Tricor sent to [facility] [place]. Let me know if he needs it changed to [facility]

## 2023-11-18 ENCOUNTER — Other Ambulatory Visit (HOSPITAL_COMMUNITY): Payer: Self-pay

## 2023-11-19 ENCOUNTER — Other Ambulatory Visit: Payer: Self-pay

## 2023-12-19 ENCOUNTER — Other Ambulatory Visit: Payer: Self-pay

## 2023-12-19 ENCOUNTER — Encounter: Payer: Self-pay | Admitting: Family Medicine

## 2023-12-19 ENCOUNTER — Other Ambulatory Visit (HOSPITAL_COMMUNITY): Payer: Self-pay

## 2023-12-19 ENCOUNTER — Other Ambulatory Visit (HOSPITAL_BASED_OUTPATIENT_CLINIC_OR_DEPARTMENT_OTHER): Payer: Self-pay

## 2023-12-19 DIAGNOSIS — E1165 Type 2 diabetes mellitus with hyperglycemia: Secondary | ICD-10-CM

## 2023-12-19 MED ORDER — ACCU-CHEK SOFTCLIX LANCETS MISC
12 refills | Status: AC
Start: 2023-12-19 — End: ?
  Filled 2023-12-19: qty 100, 33d supply, fill #0

## 2023-12-19 MED ORDER — BLOOD GLUCOSE TEST VI STRP
1.0000 | ORAL_STRIP | Freq: Three times a day (TID) | 0 refills | Status: DC
Start: 2023-12-19 — End: 2023-12-25
  Filled 2023-12-19 – 2023-12-25 (×2): qty 100, 34d supply, fill #0

## 2023-12-20 ENCOUNTER — Other Ambulatory Visit (HOSPITAL_COMMUNITY): Payer: Self-pay

## 2023-12-22 ENCOUNTER — Other Ambulatory Visit (HOSPITAL_COMMUNITY): Payer: Self-pay

## 2023-12-23 ENCOUNTER — Other Ambulatory Visit (HOSPITAL_COMMUNITY): Payer: Self-pay

## 2023-12-23 DIAGNOSIS — N181 Chronic kidney disease, stage 1: Secondary | ICD-10-CM | POA: Diagnosis not present

## 2023-12-23 DIAGNOSIS — G4733 Obstructive sleep apnea (adult) (pediatric): Secondary | ICD-10-CM | POA: Diagnosis not present

## 2023-12-23 DIAGNOSIS — E1122 Type 2 diabetes mellitus with diabetic chronic kidney disease: Secondary | ICD-10-CM | POA: Diagnosis not present

## 2023-12-23 DIAGNOSIS — E1129 Type 2 diabetes mellitus with other diabetic kidney complication: Secondary | ICD-10-CM | POA: Diagnosis not present

## 2023-12-24 ENCOUNTER — Other Ambulatory Visit (HOSPITAL_COMMUNITY): Payer: Self-pay | Admitting: Nephrology

## 2023-12-24 ENCOUNTER — Other Ambulatory Visit (HOSPITAL_COMMUNITY): Payer: Self-pay

## 2023-12-24 DIAGNOSIS — N189 Chronic kidney disease, unspecified: Secondary | ICD-10-CM

## 2023-12-24 NOTE — Addendum Note (Signed)
 Addended by: Neale Burly on: 12/24/2023 10:07 AM   Modules accepted: Orders

## 2023-12-25 ENCOUNTER — Other Ambulatory Visit (HOSPITAL_COMMUNITY): Payer: Self-pay

## 2023-12-25 ENCOUNTER — Other Ambulatory Visit: Payer: Self-pay

## 2023-12-25 MED ORDER — BLOOD GLUCOSE TEST VI STRP
1.0000 | ORAL_STRIP | Freq: Three times a day (TID) | 0 refills | Status: AC
  Filled 2023-12-25: qty 100, 34d supply, fill #0

## 2023-12-25 MED ORDER — BLOOD GLUCOSE MONITOR SYSTEM W/DEVICE KIT
1.0000 | PACK | Freq: Three times a day (TID) | 0 refills | Status: AC
  Filled 2023-12-25: qty 1, 30d supply, fill #0

## 2023-12-25 MED ORDER — FREESTYLE LANCETS MISC
1.0000 | Freq: Three times a day (TID) | 0 refills | Status: AC
  Filled 2023-12-25: qty 100, 30d supply, fill #0

## 2023-12-25 MED ORDER — LANCET DEVICE MISC
1.0000 | Freq: Three times a day (TID) | 0 refills | Status: AC
  Filled 2023-12-25: qty 1, 30d supply, fill #0

## 2023-12-25 NOTE — Addendum Note (Signed)
 Addended by: Neale Burly on: 12/25/2023 03:38 PM   Modules accepted: Orders

## 2023-12-26 ENCOUNTER — Other Ambulatory Visit: Payer: Self-pay

## 2023-12-26 ENCOUNTER — Inpatient Hospital Stay (HOSPITAL_COMMUNITY): Admission: RE | Admit: 2023-12-26 | Source: Ambulatory Visit

## 2024-01-02 ENCOUNTER — Ambulatory Visit (HOSPITAL_COMMUNITY)

## 2024-01-05 ENCOUNTER — Ambulatory Visit (HOSPITAL_COMMUNITY): Admission: RE | Admit: 2024-01-05 | Source: Ambulatory Visit

## 2024-01-05 ENCOUNTER — Encounter (HOSPITAL_COMMUNITY): Payer: Self-pay

## 2024-02-03 ENCOUNTER — Ambulatory Visit: Payer: Commercial Managed Care - PPO | Admitting: Family Medicine

## 2024-02-03 ENCOUNTER — Encounter: Payer: Self-pay | Admitting: Family Medicine

## 2024-02-10 ENCOUNTER — Encounter: Payer: Self-pay | Admitting: Family Medicine

## 2024-02-10 ENCOUNTER — Other Ambulatory Visit: Payer: Self-pay

## 2024-02-10 ENCOUNTER — Ambulatory Visit (INDEPENDENT_AMBULATORY_CARE_PROVIDER_SITE_OTHER): Admitting: Family Medicine

## 2024-02-10 ENCOUNTER — Other Ambulatory Visit (HOSPITAL_COMMUNITY): Payer: Self-pay

## 2024-02-10 VITALS — BP 118/73 | HR 83 | Temp 98.0°F | Ht 73.0 in | Wt 334.0 lb

## 2024-02-10 DIAGNOSIS — F1010 Alcohol abuse, uncomplicated: Secondary | ICD-10-CM | POA: Diagnosis not present

## 2024-02-10 DIAGNOSIS — E1159 Type 2 diabetes mellitus with other circulatory complications: Secondary | ICD-10-CM | POA: Diagnosis not present

## 2024-02-10 DIAGNOSIS — E1121 Type 2 diabetes mellitus with diabetic nephropathy: Secondary | ICD-10-CM | POA: Diagnosis not present

## 2024-02-10 DIAGNOSIS — E1129 Type 2 diabetes mellitus with other diabetic kidney complication: Secondary | ICD-10-CM | POA: Diagnosis not present

## 2024-02-10 DIAGNOSIS — F331 Major depressive disorder, recurrent, moderate: Secondary | ICD-10-CM | POA: Diagnosis not present

## 2024-02-10 DIAGNOSIS — Z720 Tobacco use: Secondary | ICD-10-CM

## 2024-02-10 DIAGNOSIS — F419 Anxiety disorder, unspecified: Secondary | ICD-10-CM

## 2024-02-10 DIAGNOSIS — E1169 Type 2 diabetes mellitus with other specified complication: Secondary | ICD-10-CM | POA: Diagnosis not present

## 2024-02-10 DIAGNOSIS — E1165 Type 2 diabetes mellitus with hyperglycemia: Secondary | ICD-10-CM | POA: Diagnosis not present

## 2024-02-10 DIAGNOSIS — Z7985 Long-term (current) use of injectable non-insulin antidiabetic drugs: Secondary | ICD-10-CM | POA: Diagnosis not present

## 2024-02-10 DIAGNOSIS — E785 Hyperlipidemia, unspecified: Secondary | ICD-10-CM | POA: Diagnosis not present

## 2024-02-10 DIAGNOSIS — I152 Hypertension secondary to endocrine disorders: Secondary | ICD-10-CM | POA: Diagnosis not present

## 2024-02-10 DIAGNOSIS — R062 Wheezing: Secondary | ICD-10-CM

## 2024-02-10 LAB — BAYER DCA HB A1C WAIVED: HB A1C (BAYER DCA - WAIVED): 6.9 % — ABNORMAL HIGH (ref 4.8–5.6)

## 2024-02-10 LAB — LIPID PANEL

## 2024-02-10 MED ORDER — SEMAGLUTIDE (1 MG/DOSE) 4 MG/3ML ~~LOC~~ SOPN
1.0000 mg | PEN_INJECTOR | SUBCUTANEOUS | 0 refills | Status: AC
Start: 2024-02-10 — End: 2024-03-10
  Filled 2024-02-10: qty 3, 28d supply, fill #0

## 2024-02-10 MED ORDER — ALBUTEROL SULFATE HFA 108 (90 BASE) MCG/ACT IN AERS
2.0000 | INHALATION_SPRAY | Freq: Four times a day (QID) | RESPIRATORY_TRACT | 2 refills | Status: AC | PRN
  Filled 2024-02-10: qty 6.7, 25d supply, fill #0

## 2024-02-10 MED ORDER — SEMAGLUTIDE (2 MG/DOSE) 8 MG/3ML ~~LOC~~ SOPN
2.0000 mg | PEN_INJECTOR | SUBCUTANEOUS | 3 refills | Status: DC
  Filled 2024-02-10: qty 3, fill #0
  Filled 2024-04-25: qty 3, 28d supply, fill #0
  Filled 2024-06-07: qty 3, 28d supply, fill #1
  Filled 2024-08-09: qty 3, 28d supply, fill #2
  Filled 2024-09-23: qty 3, 28d supply, fill #3

## 2024-02-10 NOTE — Progress Notes (Signed)
 Subjective:  Patient ID: Casey Hoover, male    DOB: 06-Nov-1980, 43 y.o.   MRN: [id number]  Patient Care Team: Chrystine Crate, FNP as PCP - General (Family Medicine)   Chief Complaint:  Medical Management of Chronic Issues (3 month follow up)  HPI: Casey Hoover is a 43 y.o. male presenting on 02/10/2024 for Medical Management of Chronic Issues (3 month follow up)  HPI Hypertension associated with diabetes (HCC) Has BP monitor at home No BP at home average - not checking  ROS Denies anxiety, fatigue, peripheral edema, changes to vision, chest pain, headaches, palpitations, sweats, SOB, PND, orthopnea Meds lisinopril  - 10 mg  Denies side effects  CAD risks Diabetes Mellitus, hypertension, hypercholesterolemia/hyperlipidemia  Type 2 diabetes mellitus with hyperglycemia, without long-term current use of insulin (HCC) Glucometer:not checking  Taking medication(s): ozempic   Last eye exam: due  Last foot exam: due  Last A1c:  Lab Results  Component Value Date   HGBA1C 7.3 (H) 11/05/2023   Nephropathy screen indicated?: sent to nephrology  Planning for a 24 hour urine.  Last flu, zoster and/or pneumovax:  Immunization History  Administered Date(s) Administered   DTP 03/14/1988   DTaP 03/14/1988   Hepatitis B 08/07/1994, 09/20/1994, 02/05/1995   IPV 03/14/1988   Influenza,inj,Quad PF,6+ Mos 08/11/2017, 07/23/2018, 09/14/2019, 09/14/2019   Influenza,inj,Quad PF,6-35 Mos 08/11/2017   Influenza-Unspecified 09/14/2019   MMR 09/20/1994   OPV 03/14/1988   ROS: Denies dizziness, LOC, polyuria, polydipsia, unintended weight loss/gain, foot ulcerations, numbness or tingling in extremities, shortness of breath or chest pain.  Hyperlipidemia associated with type 2 diabetes mellitus (HCC) Lipid/Cholesterol, Follow-up  Last lipid panel Other pertinent labs  Lab Results  Component Value Date   CHOL 183 11/05/2023   HDL 27 (L) 11/05/2023   LDLCALC 97 11/05/2023    TRIG 349 (H) 11/05/2023   CHOLHDL 6.8 (H) 11/05/2023   Lab Results  Component Value Date   ALT 42 11/05/2023   AST 25 11/05/2023   PLT 217 03/26/2023   TSH 1.820 07/21/2018     He was last seen for this 3 months ago.  Management since that visit includes crestor  and tricor    He reports excellent compliance with treatment. He is not having side effects.   Symptoms: No chest pain No chest pressure/discomfort  No dyspnea No lower extremity edema  No numbness or tingling of extremity No orthopnea  No palpitations No paroxysmal nocturnal dyspnea  No speech difficulty No syncope   Current diet: in general, an "unhealthy" diet Current exercise:  running business for grading and hauling   The 10-year ASCVD risk score (Arnett DK, et al., 2019) is: 14.9%  --------------------------------------------------------------------------------------------------- Alcohol abuse States that he has slowed down a little bit. Drinking about 12 pack in 3 days or 12 pack in one night depending on the day.   Tobacco abuse Started smoking at 18 years  1.5 PPD   Recurrent moderate major depressive disorder with anxiety (HCC) States that  Denies SI.   OSA (obstructive sleep apnea) States that     Relevant past medical, surgical, family, and social history reviewed and updated as indicated.  Allergies and medications reviewed and updated. Data reviewed: Chart in Epic.   Past Medical History:  Diagnosis Date   Diabetes mellitus without complication (HCC)    Diabetic nephropathy (HCC) 12/29/2020   Gout    HTN (hypertension)    Hyperlipidemia 12/29/2020   Low testosterone     OSA (obstructive sleep apnea) 08/04/2014  Past Surgical History:  Procedure Laterality Date   CHOLECYSTECTOMY     WISDOM TOOTH EXTRACTION  2010    Social History   Socioeconomic History   Marital status: Married    Spouse name: Not on file   Number of children: Not on file   Years of education: Not on  file   Highest education level: 12th grade  Occupational History   Occupation: Grading    Comment: Psychologist, sport and exercise  Tobacco Use   Smoking status: Every Day    Current packs/day: 1.00    Average packs/day: 1 pack/day for 15.0 years (15.0 ttl pk-yrs)    Types: Cigarettes   Smokeless tobacco: Never  Vaping Use   Vaping status: Never Used  Substance and Sexual Activity   Alcohol use: Yes    Alcohol/week: 15.0 standard drinks of alcohol    Types: 15 Cans of beer per week    Comment: Updated 12/26/20   Drug use: No   Sexual activity: Not on file  Other Topics Concern   Not on file  Social History Narrative   Not on file   Social Drivers of Health   Financial Resource Strain: Low Risk  (03/25/2023)   Overall Financial Resource Strain (CARDIA)    Difficulty of Paying Living Expenses: Not hard at all  Food Insecurity: No Food Insecurity (03/25/2023)   Hunger Vital Sign    Worried About Running Out of Food in the Last Year: Never true    Ran Out of Food in the Last Year: Never true  Transportation Needs: No Transportation Needs (03/25/2023)   PRAPARE - Administrator, Civil Service (Medical): No    Lack of Transportation (Non-Medical): No  Physical Activity: Unknown (03/25/2023)   Exercise Vital Sign    Days of Exercise per Week: Patient declined    Minutes of Exercise per Session: Not on file  Stress: Patient Declined (03/25/2023)   [employer] of Occupational Health - Occupational Stress Questionnaire    Feeling of Stress : Patient declined  Social Connections: Moderately Integrated (03/25/2023)   Social Connection and Isolation Panel [NHANES]    Frequency of Communication with Friends and Family: More than three times a week    Frequency of Social Gatherings with Friends and Family: More than three times a week    Attends Religious Services: More than 4 times per year    Active Member of [employer] or Organizations: No    Attends Engineer, structural: Not on file     Marital Status: Married  Intimate Partner Violence: Unknown (01/23/2022)   Received from [employer], [facility]   HITS    Physically Hurt: Not on file    Insult or Talk Down To: Not on file    Threaten Physical Harm: Not on file    Scream or Curse: Not on file    Outpatient Encounter Medications as of 02/10/2024  Medication Sig   Accu-Chek Softclix Lancets lancets use as directed   Blood Glucose Monitoring Suppl (BLOOD GLUCOSE MONITOR SYSTEM) w/Device KIT use to test blood glucose in the morning, at noon, and at bedtime.   fenofibrate  (TRICOR ) 145 MG tablet Take 1 tablet (145 mg total) by mouth daily.   lisinopril  (ZESTRIL ) 10 MG tablet Take 1 tablet (10 mg total) by mouth daily.   rosuvastatin  (CRESTOR ) 40 MG tablet Take 1 tablet (40 mg total) by mouth daily.   [DISCONTINUED] albuterol  (VENTOLIN  HFA) 108 (90 Base) MCG/ACT inhaler Inhale 2 puffs into the lungs  every 6 (six) hours as needed for wheezing or shortness of breath.   No facility-administered encounter medications on file as of 02/10/2024.    Allergies  Allergen Reactions   Sulfa Antibiotics Rash   Metformin  And Related Diarrhea    Review of Systems As per HPI  Objective:  BP 118/73   Pulse 83   Temp 98 F (36.7 C)   Ht 6\' 1"  (1.854 m)   Wt (!) 334 lb (151.5 kg)   SpO2 96%   BMI 44.07 kg/m    Wt Readings from Last 3 Encounters:  02/10/24 (!) 334 lb (151.5 kg)  11/05/23 (!) 328 lb (148.8 kg)  05/27/23 (!) 333 lb (151 kg)    Physical Exam Constitutional:      General: He is awake. He is not in acute distress.    Appearance: Normal appearance. He is well-developed and well-groomed. He is obese. He is not ill-appearing, toxic-appearing or diaphoretic.  Cardiovascular:     Rate and Rhythm: Normal rate and regular rhythm.     Pulses: Normal pulses.          Radial pulses are 2+ on the right side and 2+ on the left side.       Posterior tibial pulses are 2+ on the right side and 2+ on the left side.      Heart sounds: Normal heart sounds. No murmur heard.    No gallop.  Pulmonary:     Effort: Pulmonary effort is normal. No respiratory distress.     Breath sounds: No stridor. Examination of the right-upper field reveals wheezing. Examination of the left-upper field reveals wheezing. Examination of the right-middle field reveals wheezing. Examination of the left-middle field reveals wheezing. Examination of the right-lower field reveals wheezing. Examination of the left-lower field reveals wheezing. Wheezing present. No rhonchi or rales.  Musculoskeletal:     Cervical back: Full passive range of motion without pain and neck supple.     Right lower leg: No edema.     Left lower leg: No edema.  Feet:     Right foot:     Protective Sensation: 10 sites tested.  10 sites sensed.     Skin integrity: Dry skin present.     Toenail Condition: Right toenails are long.     Left foot:     Protective Sensation: 10 sites tested.  10 sites sensed.     Skin integrity: Dry skin present.     Toenail Condition: Left toenails are long.  Skin:    General: Skin is warm.     Capillary Refill: Capillary refill takes less than 2 seconds.  Neurological:     General: No focal deficit present.     Mental Status: He is alert, oriented to person, place, and time and easily aroused. Mental status is at baseline.     GCS: GCS eye subscore is 4. GCS verbal subscore is 5. GCS motor subscore is 6.     Motor: No weakness.  Psychiatric:        Attention and Perception: Attention and perception normal.        Mood and Affect: Mood and affect normal.        Speech: Speech normal.        Behavior: Behavior normal. Behavior is cooperative.        Thought Content: Thought content normal. Thought content does not include homicidal or suicidal ideation. Thought content does not include homicidal or suicidal plan.  Cognition and Memory: Cognition and memory normal.        Judgment: Judgment normal.     Results for  orders placed or performed in visit on 11/05/23  Bayer DCA Hb A1c Waived   Collection Time: 11/05/23 11:14 AM  Result Value Ref Range   HB A1C (BAYER DCA - WAIVED) 7.3 (H) 4.8 - 5.6 %  CMP14+EGFR   Collection Time: 11/05/23 11:16 AM  Result Value Ref Range   Glucose 246 (H) 70 - 99 mg/dL   BUN 10 6 - 24 mg/dL   Creatinine, Ser 0.98 0.76 - 1.27 mg/dL   eGFR 96 >11 BJ/YNW/2.95   BUN/Creatinine Ratio 10 9 - 20   Sodium 139 134 - 144 mmol/L   Potassium 4.8 3.5 - 5.2 mmol/L   Chloride 100 96 - 106 mmol/L   CO2 21 20 - 29 mmol/L   Calcium  9.6 8.7 - 10.2 mg/dL   Total Protein 7.4 6.0 - 8.5 g/dL   Albumin 4.6 4.1 - 5.1 g/dL   Globulin, Total 2.8 1.5 - 4.5 g/dL   Bilirubin Total 0.7 0.0 - 1.2 mg/dL   Alkaline Phosphatase 112 44 - 121 IU/L   AST 25 0 - 40 IU/L   ALT 42 0 - 44 IU/L  Lipid panel   Collection Time: 11/05/23 11:16 AM  Result Value Ref Range   Cholesterol, Total 183 100 - 199 mg/dL   Triglycerides 621 (H) 0 - 149 mg/dL   HDL 27 (L) >30 mg/dL   VLDL Cholesterol Cal 59 (H) 5 - 40 mg/dL   LDL Chol Calc (NIH) 97 0 - 99 mg/dL   Chol/HDL Ratio 6.8 (H) 0.0 - 5.0 ratio  Microalbumin / creatinine urine ratio   Collection Time: 11/05/23 11:56 AM  Result Value Ref Range   Creatinine, Urine 168.2 Not Estab. mg/dL   Microalbumin, Urine 865.7 Not Estab. ug/mL   Microalb/Creat Ratio 296 (H) 0 - 29 mg/g creat       02/10/2024    9:05 AM 11/05/2023   11:10 AM 05/27/2023    9:09 AM 03/26/2023    8:40 AM 07/03/2022    9:59 AM  Depression screen PHQ 2/9  Decreased Interest 0 0 0 1 0  Down, Depressed, Hopeless 0 0 0 0 0  PHQ - 2 Score 0 0 0 1 0  Altered sleeping   0 1   Tired, decreased energy   0 3   Change in appetite   0 0   Feeling bad or failure about yourself    0 0   Trouble concentrating   0 1   Moving slowly or fidgety/restless   0 0   Suicidal thoughts   0 0   PHQ-9 Score   0 6   Difficult doing work/chores   Not difficult at all Somewhat difficult        02/10/2024     9:05 AM 11/05/2023   11:11 AM 05/27/2023    9:10 AM 03/26/2023    8:41 AM  GAD 7 : Generalized Anxiety Score  Nervous, Anxious, on Edge 0 1 0 1  Control/stop worrying 0 1 0 1  Worry too much - different things 0 1 0 1  Trouble relaxing 0 1 0 1  Restless 0 1 0 1  Easily annoyed or irritable 0 1 0 1  Afraid - awful might happen 0 0 0 0  Total GAD 7 Score 0 6 0 6  Anxiety Difficulty Not difficult at all  Not difficult at all Not difficult at all Not difficult at all   Pertinent labs & imaging results that were available during my care of the patient were reviewed by me and considered in my medical decision making.  Assessment & Plan:  Lister "Casey Hoover" was seen today for medical management of chronic issues.  Diagnoses and all orders for this visit: 1. Hypertension associated with diabetes (HCC) (Primary) Well controlled. Continue current regimen.  - CBC with Differential/Platelet  2. Type 2 diabetes mellitus with hyperglycemia, without long-term current use of insulin (HCC) At goal. Will refill medication as below. Labs as below. Will communicate results to patient once available. Will await results to determine next steps.  - Bayer DCA Hb A1c Waived - CMP14+EGFR - Vitamin B12 - VITAMIN D  25 Hydroxy (Vit-D Deficiency, Fractures) - Semaglutide , 1 MG/DOSE, 4 MG/3ML SOPN; Inject 1 mg as directed once a week for 4 doses.  Dispense: 3 mL; Refill: 0 - Semaglutide , 2 MG/DOSE, 8 MG/3ML SOPN; Inject 2 mg as directed once a week.  Dispense: 3 mL; Refill: 3  3. Diabetic nephropathy associated with type 2 diabetes mellitus [facility]) Discussed with patient the need for follow up with nephrology. He has established care with Carrolyn Clan, MD. Encouraged him to continue to follow up.   4. Type 2 diabetes mellitus with microalbuminuria (HCC) As above.  - Semaglutide , 1 MG/DOSE, 4 MG/3ML SOPN; Inject 1 mg as directed once a week for 4 doses.  Dispense: 3 mL; Refill: 0 - Semaglutide , 2 MG/DOSE, 8 MG/3ML SOPN;  Inject 2 mg as directed once a week.  Dispense: 3 mL; Refill: 3  5. Long-term current use of injectable noninsulin antidiabetic medication As above.  - Semaglutide , 1 MG/DOSE, 4 MG/3ML SOPN; Inject 1 mg as directed once a week for 4 doses.  Dispense: 3 mL; Refill: 0 - Semaglutide , 2 MG/DOSE, 8 MG/3ML SOPN; Inject 2 mg as directed once a week.  Dispense: 3 mL; Refill: 3  6. Hyperlipidemia associated with type 2 diabetes mellitus (HCC) Labs as below. Will communicate results to patient once available. Will await results to determine next steps.  - Lipid panel  7. Morbid obesity (HCC) Discussed with patient to continue healthy lifestyle choices, including diet (rich in fruits, vegetables, and lean proteins, and low in salt and simple carbohydrates) and exercise (at least 30 minutes of moderate physical activity daily). Limit beverages high is sugar. Recommended at least 80-100 oz of water daily.   8. Alcohol abuse Discussed with patient reducing intake and cessation.   9. Tobacco abuse Discussed with patient reducing intake and cessation. Not ready to quit at this time. Will continue to assess readiness.   10. Recurrent moderate major depressive disorder with anxiety (HCC) Stable. Denies SI. Safety contract established.   11. Wheezing Patient believes that symptoms are related ot allergies. Did not clear with coughing. Will start medication as below. If symptoms continue, would like for patient to follow up for imaging and possible referral to pulmonology.  - albuterol  (VENTOLIN  HFA) 108 (90 Base) MCG/ACT inhaler; Inhale 2 puffs into the lungs every 6 (six) hours as needed for wheezing or shortness of breath.  Dispense: 8 g; Refill: 2    Continue all other maintenance medications.  Follow up plan: Return in about 3 months (around 05/11/2024) for Chronic Condition Follow up.   Continue healthy lifestyle choices, including diet (rich in fruits, vegetables, and lean proteins, and low in  salt and simple carbohydrates) and exercise (at least 30 minutes of  moderate physical activity daily).  Written and verbal instructions provided   The above assessment and management plan was discussed with the patient. The patient verbalized understanding of and has agreed to the management plan. Patient is aware to call the clinic if they develop any new symptoms or if symptoms persist or worsen. Patient is aware when to return to the clinic for a follow-up visit. Patient educated on when it is appropriate to go to the emergency department.   Jacqualyn Mates, DNP-FNP [facility] [address] [place], [place] [address] [phone]

## 2024-02-10 NOTE — Patient Instructions (Addendum)
 [facility] - Dr. Carrolyn Clan [address] [place], [place] [address] [phone]   [facility] [address] [place] [address] [phone]

## 2024-02-11 ENCOUNTER — Other Ambulatory Visit (HOSPITAL_COMMUNITY): Payer: Self-pay

## 2024-02-11 ENCOUNTER — Other Ambulatory Visit: Payer: Self-pay

## 2024-02-11 ENCOUNTER — Encounter: Payer: Self-pay | Admitting: Family Medicine

## 2024-02-11 LAB — VITAMIN B12: Vitamin B-12: 616 pg/mL (ref 232–1245)

## 2024-02-11 LAB — CBC WITH DIFFERENTIAL/PLATELET
Basophils Absolute: 0.1 10*3/uL (ref 0.0–0.2)
Basos: 1 %
EOS (ABSOLUTE): 0.2 10*3/uL (ref 0.0–0.4)
Eos: 2 %
Hematocrit: 47.5 % (ref 37.5–51.0)
Hemoglobin: 15.7 g/dL (ref 13.0–17.7)
Immature Grans (Abs): 0.1 10*3/uL (ref 0.0–0.1)
Immature Granulocytes: 1 %
Lymphocytes Absolute: 2.2 10*3/uL (ref 0.7–3.1)
Lymphs: 20 %
MCH: 30 pg (ref 26.6–33.0)
MCHC: 33.1 g/dL (ref 31.5–35.7)
MCV: 91 fL (ref 79–97)
Monocytes Absolute: 0.8 10*3/uL (ref 0.1–0.9)
Monocytes: 7 %
Neutrophils Absolute: 7.6 10*3/uL — ABNORMAL HIGH (ref 1.4–7.0)
Neutrophils: 69 %
Platelets: 223 10*3/uL (ref 150–450)
RBC: 5.24 x10E6/uL (ref 4.14–5.80)
RDW: 12.3 % (ref 11.6–15.4)
WBC: 10.8 10*3/uL (ref 3.4–10.8)

## 2024-02-11 LAB — CMP14+EGFR
ALT: 38 IU/L (ref 0–44)
AST: 26 IU/L (ref 0–40)
Albumin: 4.3 g/dL (ref 4.1–5.1)
Alkaline Phosphatase: 97 IU/L (ref 44–121)
BUN/Creatinine Ratio: 9 (ref 9–20)
BUN: 10 mg/dL (ref 6–24)
Bilirubin Total: 0.6 mg/dL (ref 0.0–1.2)
CO2: 21 mmol/L (ref 20–29)
Calcium: 9.6 mg/dL (ref 8.7–10.2)
Chloride: 101 mmol/L (ref 96–106)
Creatinine, Ser: 1.1 mg/dL (ref 0.76–1.27)
Globulin, Total: 2.6 g/dL (ref 1.5–4.5)
Glucose: 153 mg/dL — ABNORMAL HIGH (ref 70–99)
Potassium: 4.6 mmol/L (ref 3.5–5.2)
Sodium: 138 mmol/L (ref 134–144)
Total Protein: 6.9 g/dL (ref 6.0–8.5)
eGFR: 87 mL/min/{1.73_m2} (ref >59)

## 2024-02-11 LAB — LIPID PANEL
Cholesterol, Total: 108 mg/dL (ref 100–199)
HDL: 28 mg/dL — ABNORMAL LOW (ref >39)
LDL CALC COMMENT:: 3.9 ratio (ref 0.0–5.0)
LDL Chol Calc (NIH): 39 mg/dL (ref 0–99)
Triglycerides: 266 mg/dL — ABNORMAL HIGH (ref 0–149)
VLDL Cholesterol Cal: 41 mg/dL — ABNORMAL HIGH (ref 5–40)

## 2024-02-11 LAB — VITAMIN D 25 HYDROXY (VIT D DEFICIENCY, FRACTURES): Vit D, 25-Hydroxy: 10.7 ng/mL — ABNORMAL LOW (ref 30.0–100.0)

## 2024-02-11 MED ORDER — VITAMIN D (ERGOCALCIFEROL) 1.25 MG (50000 UNIT) PO CAPS
50000.0000 [IU] | ORAL_CAPSULE | ORAL | 0 refills | Status: AC
  Filled 2024-02-11: qty 12, 84d supply, fill #0

## 2024-02-11 NOTE — Progress Notes (Signed)
 Neutrophils stable. Cholesterol has improved. Recommend bake, broil, or grill foods. Avoid fried, greasy, and fatty foods. Avoid fast foods. Increase intake of fiber-rich whole grains. Exercise encouraged - at least 150 minutes per week and advance as tolerated. Vitamin D  level is low. I have sent in a weekly supplement to take for the next 12 weeks. After that, take a daily OTC vitamin D  supplement with 1000-2000 IU. A1C is controlled!

## 2024-02-11 NOTE — Addendum Note (Signed)
 Addended by: Jacqualyn Mates on: 02/11/2024 09:26 AM   Modules accepted: Orders

## 2024-02-17 ENCOUNTER — Encounter: Payer: Self-pay | Admitting: *Deleted

## 2024-03-10 ENCOUNTER — Telehealth: Payer: Self-pay | Admitting: Family Medicine

## 2024-03-10 NOTE — Telephone Encounter (Signed)
 Left detailed message for patient to call back to let us  know if they currently see an eye doctor to have the health of the eyes checked with having diabetes and if not, we do offer retinal imaging appointments here, to check the health of our patients eyes, if she would like to schedule an appt.

## 2024-04-26 ENCOUNTER — Other Ambulatory Visit: Payer: Self-pay

## 2024-04-26 ENCOUNTER — Other Ambulatory Visit (HOSPITAL_COMMUNITY): Payer: Self-pay

## 2024-05-12 ENCOUNTER — Encounter: Payer: Self-pay | Admitting: Family Medicine

## 2024-05-12 ENCOUNTER — Ambulatory Visit: Admitting: Family Medicine

## 2024-06-07 ENCOUNTER — Other Ambulatory Visit (HOSPITAL_COMMUNITY): Payer: Self-pay

## 2024-08-09 ENCOUNTER — Other Ambulatory Visit: Payer: Self-pay

## 2024-09-23 ENCOUNTER — Other Ambulatory Visit (HOSPITAL_COMMUNITY): Payer: Self-pay

## 2024-11-01 ENCOUNTER — Other Ambulatory Visit: Payer: Self-pay

## 2024-11-25 ENCOUNTER — Ambulatory Visit: Admitting: Family Medicine

## 2024-11-25 ENCOUNTER — Encounter: Payer: Self-pay | Admitting: Family Medicine

## 2024-11-25 VITALS — BP 129/74 | HR 84 | Temp 98.1°F | Wt 335.0 lb

## 2024-11-25 DIAGNOSIS — E1129 Type 2 diabetes mellitus with other diabetic kidney complication: Secondary | ICD-10-CM

## 2024-11-25 DIAGNOSIS — E1159 Type 2 diabetes mellitus with other circulatory complications: Secondary | ICD-10-CM | POA: Diagnosis not present

## 2024-11-25 DIAGNOSIS — E1121 Type 2 diabetes mellitus with diabetic nephropathy: Secondary | ICD-10-CM | POA: Diagnosis not present

## 2024-11-25 DIAGNOSIS — R062 Wheezing: Secondary | ICD-10-CM | POA: Diagnosis not present

## 2024-11-25 DIAGNOSIS — Z7985 Long-term (current) use of injectable non-insulin antidiabetic drugs: Secondary | ICD-10-CM | POA: Diagnosis not present

## 2024-11-25 DIAGNOSIS — E1165 Type 2 diabetes mellitus with hyperglycemia: Secondary | ICD-10-CM

## 2024-11-25 DIAGNOSIS — I152 Hypertension secondary to endocrine disorders: Secondary | ICD-10-CM | POA: Diagnosis not present

## 2024-11-25 DIAGNOSIS — K760 Fatty (change of) liver, not elsewhere classified: Secondary | ICD-10-CM | POA: Diagnosis not present

## 2024-11-25 DIAGNOSIS — E785 Hyperlipidemia, unspecified: Secondary | ICD-10-CM

## 2024-11-25 DIAGNOSIS — E559 Vitamin D deficiency, unspecified: Secondary | ICD-10-CM

## 2024-11-25 DIAGNOSIS — Z114 Encounter for screening for human immunodeficiency virus [HIV]: Secondary | ICD-10-CM

## 2024-11-25 DIAGNOSIS — Z1159 Encounter for screening for other viral diseases: Secondary | ICD-10-CM

## 2024-11-25 DIAGNOSIS — E1169 Type 2 diabetes mellitus with other specified complication: Secondary | ICD-10-CM

## 2024-11-25 LAB — BAYER DCA HB A1C WAIVED: HB A1C (BAYER DCA - WAIVED): 6.9 % — ABNORMAL HIGH (ref 4.8–5.6)

## 2024-11-25 MED ORDER — FENOFIBRATE 145 MG PO TABS: 145.0000 mg | ORAL_TABLET | Freq: Every day | ORAL | 3 refills | Status: AC

## 2024-11-25 MED ORDER — ROSUVASTATIN CALCIUM 40 MG PO TABS: 40.0000 mg | ORAL_TABLET | Freq: Every day | ORAL | 1 refills | Status: AC

## 2024-11-25 MED ORDER — LISINOPRIL 10 MG PO TABS: 10.0000 mg | ORAL_TABLET | Freq: Every day | ORAL | 1 refills | Status: AC

## 2024-11-25 MED ORDER — FLUTICASONE-SALMETEROL 115-21 MCG/ACT IN AERO: 2.0000 | INHALATION_SPRAY | Freq: Two times a day (BID) | RESPIRATORY_TRACT | 12 refills | Status: AC

## 2024-11-25 MED ORDER — SEMAGLUTIDE (2 MG/DOSE) 8 MG/3ML ~~LOC~~ SOPN: 2.0000 mg | PEN_INJECTOR | SUBCUTANEOUS | 3 refills | Status: AC

## 2024-11-25 NOTE — Progress Notes (Signed)
 "  Established Patient Office Visit  Subjective   Patient ID: Casey Hoover, male    DOB: [birth year]  Age: 44 y.o. MRN: [id number]  Chief Complaint  Patient presents with   Medical Management of Chronic Issues    HPI  History of Present Illness   Casey Hoover is a 44 year old male with type 2 diabetes who presents for a follow-up visit.  Glycemic control and diabetes management - Type 2 diabetes mellitus. - Ran out of Ozempic  approximately 3 weeks ago; no side effects while on medication. - Last hemoglobin A1c was 6.9%. - Has not been monitoring blood glucose levels recently. - Previously on metformin ; experienced side effects while taking it. - Diet described as 'okay.' - Has physical job - No numbness or tingling in feet.  Hyperlipidemia and hypertension - Currently taking Crestor  and fenofibrate  for hyperlipidemia. - Currently taking lisinopril  for hypertension. - No chest pain, shortness of breath, or peripheral edema.  Respiratory symptoms - Wheezing present for approximately one month, attributed to recent cold. - No significant cough or fever. - Possesses albuterol  inhaler; infrequent use. - Wheezing intermittently, chronic - 15-year history of smoking. - No history of asthma - Wheezing noted at last OV in April  Vitamin d  supplementation - Takes a multivitamin for vitamin D  supplementation        ROS As per HPI.    Objective:     BP 129/74   Pulse 84   Temp 98.1 F (36.7 C)   Wt (!) 335 lb (152 kg)   SpO2 95%   BMI 44.20 kg/m  Wt Readings from Last 3 Encounters:  11/25/24 (!) 335 lb (152 kg)  02/10/24 (!) 334 lb (151.5 kg)  11/05/23 (!) 328 lb (148.8 kg)      Physical Exam Vitals and nursing note reviewed.  Constitutional:      General: He is not in acute distress.    Appearance: He is obese. He is not ill-appearing, toxic-appearing or diaphoretic.  Cardiovascular:     Rate and Rhythm: Normal rate and regular rhythm.      Heart sounds: No murmur heard. Pulmonary:     Effort: Pulmonary effort is normal. No respiratory distress.     Breath sounds: Examination of the right-upper field reveals wheezing. Examination of the left-middle field reveals wheezing. Examination of the right-lower field reveals wheezing. Examination of the left-lower field reveals wheezing. Wheezing present. No rhonchi or rales.  Abdominal:     General: Bowel sounds are normal.     Palpations: Abdomen is soft.  Musculoskeletal:     Right lower leg: No edema.     Left lower leg: No edema.  Skin:    General: Skin is warm and dry.  Neurological:     General: No focal deficit present.     Mental Status: He is alert and oriented to person, place, and time.  Psychiatric:        Mood and Affect: Mood normal.        Behavior: Behavior normal.      No results found for any visits on 11/25/24.    The ASCVD Risk score (Arnett DK, et al., 2019) failed to calculate for the following reasons:   The valid total cholesterol range is 130 to 320 mg/dL    Assessment & Plan:   Casey Hoover was seen today for medical management of chronic issues.  Diagnoses and all orders for this visit:  Type 2 diabetes mellitus with hyperglycemia,  without long-term current use of insulin (HCC) -     Bayer DCA Hb A1c Waived -     Microalbumin / creatinine urine ratio -     TSH -     Semaglutide , 2 MG/DOSE, 8 MG/3ML SOPN; Inject 2 mg as directed once a week.  Hypertension associated with diabetes (HCC) -     CBC with Differential/Platelet -     CMP14+EGFR -     lisinopril  (ZESTRIL ) 10 MG tablet; Take 1 tablet (10 mg total) by mouth daily.  Hyperlipidemia associated with type 2 diabetes mellitus (HCC) -     Lipid panel -     fenofibrate  (TRICOR ) 145 MG tablet; Take 1 tablet (145 mg total) by mouth daily. -     rosuvastatin  (CRESTOR ) 40 MG tablet; Take 1 tablet (40 mg total) by mouth daily.  Diabetic nephropathy associated with type 2 diabetes  mellitus (HCC) -     lisinopril  (ZESTRIL ) 10 MG tablet; Take 1 tablet (10 mg total) by mouth daily.  Morbid obesity (HCC)  Hepatic steatosis  Vitamin D  deficiency -     VITAMIN D  25 Hydroxy (Vit-D Deficiency, Fractures)  Screening for HIV (human immunodeficiency virus) -     HIV Antibody (routine testing w rflx)  Encounter for hepatitis C screening test for low risk patient -     HCV Ab w Reflex to Quant PCR  Type 2 diabetes mellitus with microalbuminuria (HCC) -     Semaglutide , 2 MG/DOSE, 8 MG/3ML SOPN; Inject 2 mg as directed once a week.  Long-term current use of injectable noninsulin antidiabetic medication -     Semaglutide , 2 MG/DOSE, 8 MG/3ML SOPN; Inject 2 mg as directed once a week.  Wheezing -     fluticasone -salmeterol (ADVAIR HFA) 115-21 MCG/ACT inhaler; Inhale 2 puffs into the lungs 2 (two) times daily. -     Ambulatory referral to Pulmonology   Assessment and Plan    Type 2 diabetes mellitus Recent lapse in Ozempic  use. Last A1c 6.9. No recent blood sugar monitoring. Metformin  allergy noted. - Restart Ozempic  at 2 mg, monitor for discomfort. - Ordered A1c test. - Follow-up in three months.  Wheezing Suspected COPD due to smoking history. Wheezing and occasional cough reported. Albuterol  used infrequently. - Prescribed Advair inhaler for daily use. - Referred to pulmonologist. - Continue albuterol  as needed.  Hypertension Continue current antihypertensive regimen.  Hyperlipidemia Managed with Crestor  and fenofibrate . - Ordered cholesterol test.  Vitamin D  deficiency - Ordered vitamin D  level test.     Diabetic nephropathy - Continue lisinopril . Microalbumin pending.   Hepatic steatosis - CMP pending.  Morbid obesity Diet, exercise, weight loss encouraged.    Return in about 3 months (around 02/22/2025) for chronic follow up.   The patient indicates understanding of these issues and agrees with the plan.  Casey CHRISTELLA Search, FNP "

## 2024-11-26 ENCOUNTER — Other Ambulatory Visit: Payer: Self-pay

## 2024-11-26 ENCOUNTER — Other Ambulatory Visit (HOSPITAL_COMMUNITY): Payer: Self-pay

## 2024-11-26 ENCOUNTER — Ambulatory Visit: Payer: Self-pay | Admitting: Family Medicine

## 2024-11-26 DIAGNOSIS — E559 Vitamin D deficiency, unspecified: Secondary | ICD-10-CM

## 2024-11-26 LAB — CMP14+EGFR
ALT: 41 [IU]/L (ref 0–44)
AST: 22 [IU]/L (ref 0–40)
Albumin: 4.5 g/dL (ref 4.1–5.1)
Alkaline Phosphatase: 116 [IU]/L (ref 47–123)
BUN/Creatinine Ratio: 8 — ABNORMAL LOW (ref 9–20)
BUN: 9 mg/dL (ref 6–24)
Bilirubin Total: 0.4 mg/dL (ref 0.0–1.2)
CO2: 21 mmol/L (ref 20–29)
Calcium: 9.8 mg/dL (ref 8.7–10.2)
Chloride: 103 mmol/L (ref 96–106)
Creatinine, Ser: 1.1 mg/dL (ref 0.76–1.27)
Globulin, Total: 2.4 g/dL (ref 1.5–4.5)
Glucose: 179 mg/dL — ABNORMAL HIGH (ref 70–99)
Potassium: 4.7 mmol/L (ref 3.5–5.2)
Sodium: 140 mmol/L (ref 134–144)
Total Protein: 6.9 g/dL (ref 6.0–8.5)
eGFR: 86 mL/min/{1.73_m2}

## 2024-11-26 LAB — CBC WITH DIFFERENTIAL/PLATELET
Basophils Absolute: 0.1 10*3/uL (ref 0.0–0.2)
Basos: 1 %
EOS (ABSOLUTE): 0.4 10*3/uL (ref 0.0–0.4)
Eos: 3 %
Hematocrit: 50.6 % (ref 37.5–51.0)
Hemoglobin: 16.3 g/dL (ref 13.0–17.7)
Immature Grans (Abs): 0.1 10*3/uL (ref 0.0–0.1)
Immature Granulocytes: 1 %
Lymphocytes Absolute: 2.1 10*3/uL (ref 0.7–3.1)
Lymphs: 19 %
MCH: 29.7 pg (ref 26.6–33.0)
MCHC: 32.2 g/dL (ref 31.5–35.7)
MCV: 92 fL (ref 79–97)
Monocytes Absolute: 0.8 10*3/uL (ref 0.1–0.9)
Monocytes: 7 %
Neutrophils Absolute: 7.7 10*3/uL — ABNORMAL HIGH (ref 1.4–7.0)
Neutrophils: 69 %
Platelets: 230 10*3/uL (ref 150–450)
RBC: 5.49 x10E6/uL (ref 4.14–5.80)
RDW: 12.6 % (ref 11.6–15.4)
WBC: 11.1 10*3/uL — ABNORMAL HIGH (ref 3.4–10.8)

## 2024-11-26 LAB — LIPID PANEL
Chol/HDL Ratio: 7.4 ratio — ABNORMAL HIGH (ref 0.0–5.0)
Cholesterol, Total: 221 mg/dL — ABNORMAL HIGH (ref 100–199)
HDL: 30 mg/dL — ABNORMAL LOW
LDL Chol Calc (NIH): 108 mg/dL — ABNORMAL HIGH (ref 0–99)
Triglycerides: 478 mg/dL — ABNORMAL HIGH (ref 0–149)
VLDL Cholesterol Cal: 83 mg/dL — ABNORMAL HIGH (ref 5–40)

## 2024-11-26 LAB — HCV AB W REFLEX TO QUANT PCR: HCV Ab: NONREACTIVE

## 2024-11-26 LAB — HCV INTERPRETATION

## 2024-11-26 LAB — MICROALBUMIN / CREATININE URINE RATIO
Creatinine, Urine: 162.4 mg/dL
Microalb/Creat Ratio: 66 mg/g{creat} — ABNORMAL HIGH (ref 0–29)
Microalbumin, Urine: 107.1 ug/mL

## 2024-11-26 LAB — VITAMIN D 25 HYDROXY (VIT D DEFICIENCY, FRACTURES): Vit D, 25-Hydroxy: 10.3 ng/mL — AB (ref 30.0–100.0)

## 2024-11-26 LAB — TSH: TSH: 2.02 u[IU]/mL (ref 0.450–4.500)

## 2024-11-26 LAB — HIV ANTIBODY (ROUTINE TESTING W REFLEX): HIV Screen 4th Generation wRfx: NONREACTIVE

## 2024-11-26 MED ORDER — VITAMIN D (ERGOCALCIFEROL) 1.25 MG (50000 UNIT) PO CAPS
50000.0000 [IU] | ORAL_CAPSULE | ORAL | 0 refills | Status: AC
  Filled 2024-11-26: qty 12, 84d supply, fill #0

## 2024-12-13 ENCOUNTER — Ambulatory Visit

## 2025-02-23 ENCOUNTER — Ambulatory Visit: Admitting: Family Medicine
